# Patient Record
Sex: Female | Born: 1935 | Race: White | Hispanic: No | Smoking: Former smoker
Health system: Southern US, Community
[De-identification: ages and names within clinical notes are randomized; demographics above are authoritative.]

## PROBLEM LIST (undated history)

## (undated) DIAGNOSIS — K5730 Diverticulosis of large intestine without perforation or abscess without bleeding: Secondary | ICD-10-CM

## (undated) DIAGNOSIS — Z5189 Encounter for other specified aftercare: Secondary | ICD-10-CM

## (undated) DIAGNOSIS — J449 Chronic obstructive pulmonary disease, unspecified: Secondary | ICD-10-CM

## (undated) DIAGNOSIS — K449 Diaphragmatic hernia without obstruction or gangrene: Secondary | ICD-10-CM

## (undated) DIAGNOSIS — R0602 Shortness of breath: Secondary | ICD-10-CM

## (undated) DIAGNOSIS — I509 Heart failure, unspecified: Secondary | ICD-10-CM

## (undated) DIAGNOSIS — I714 Abdominal aortic aneurysm, without rupture, unspecified: Secondary | ICD-10-CM

## (undated) DIAGNOSIS — J40 Bronchitis, not specified as acute or chronic: Secondary | ICD-10-CM

## (undated) DIAGNOSIS — IMO0001 Reserved for inherently not codable concepts without codable children: Secondary | ICD-10-CM

## (undated) DIAGNOSIS — M40209 Unspecified kyphosis, site unspecified: Secondary | ICD-10-CM

## (undated) DIAGNOSIS — Z87898 Personal history of other specified conditions: Secondary | ICD-10-CM

## (undated) DIAGNOSIS — K8020 Calculus of gallbladder without cholecystitis without obstruction: Secondary | ICD-10-CM

## (undated) DIAGNOSIS — D649 Anemia, unspecified: Secondary | ICD-10-CM

## (undated) DIAGNOSIS — S22000A Wedge compression fracture of unspecified thoracic vertebra, initial encounter for closed fracture: Secondary | ICD-10-CM

## (undated) DIAGNOSIS — I10 Essential (primary) hypertension: Secondary | ICD-10-CM

## (undated) DIAGNOSIS — I6529 Occlusion and stenosis of unspecified carotid artery: Secondary | ICD-10-CM

## (undated) DIAGNOSIS — N390 Urinary tract infection, site not specified: Secondary | ICD-10-CM

## (undated) DIAGNOSIS — I2510 Atherosclerotic heart disease of native coronary artery without angina pectoris: Secondary | ICD-10-CM

## (undated) HISTORY — DX: Essential (primary) hypertension: I10

## (undated) HISTORY — DX: Chronic obstructive pulmonary disease, unspecified: J44.9

## (undated) HISTORY — PX: DILATION AND CURETTAGE OF UTERUS: SHX78

## (undated) HISTORY — DX: Calculus of gallbladder without cholecystitis without obstruction: K80.20

## (undated) HISTORY — DX: Unspecified kyphosis, site unspecified: M40.209

## (undated) HISTORY — DX: Abdominal aortic aneurysm, without rupture, unspecified: I71.40

## (undated) HISTORY — DX: Heart failure, unspecified: I50.9

## (undated) HISTORY — DX: Diverticulosis of large intestine without perforation or abscess without bleeding: K57.30

## (undated) HISTORY — DX: Occlusion and stenosis of unspecified carotid artery: I65.29

## (undated) HISTORY — DX: Personal history of other specified conditions: Z87.898

## (undated) HISTORY — DX: Anemia, unspecified: D64.9

## (undated) HISTORY — PX: ABDOMINAL HYSTERECTOMY: SHX81

## (undated) HISTORY — DX: Wedge compression fracture of unspecified thoracic vertebra, initial encounter for closed fracture: S22.000A

## (undated) HISTORY — DX: Diaphragmatic hernia without obstruction or gangrene: K44.9

## (undated) HISTORY — DX: Atherosclerotic heart disease of native coronary artery without angina pectoris: I25.10

## (undated) HISTORY — DX: Abdominal aortic aneurysm, without rupture: I71.4

## (undated) HISTORY — PX: SPINE SURGERY: SHX786

## (undated) HISTORY — PX: BLADDER SUSPENSION: SHX72

---

## 2006-09-10 DIAGNOSIS — S22000A Wedge compression fracture of unspecified thoracic vertebra, initial encounter for closed fracture: Secondary | ICD-10-CM

## 2006-09-10 HISTORY — DX: Wedge compression fracture of unspecified thoracic vertebra, initial encounter for closed fracture: S22.000A

## 2007-09-11 HISTORY — PX: CARDIAC CATHETERIZATION: SHX172

## 2008-07-15 ENCOUNTER — Ambulatory Visit (HOSPITAL_COMMUNITY): Admission: RE | Admit: 2008-07-15 | Discharge: 2008-07-15 | Payer: Self-pay | Admitting: Internal Medicine

## 2008-07-28 ENCOUNTER — Encounter: Payer: Self-pay | Admitting: Interventional Radiology

## 2008-08-02 ENCOUNTER — Inpatient Hospital Stay (HOSPITAL_COMMUNITY): Admission: AD | Admit: 2008-08-02 | Discharge: 2008-08-13 | Payer: Self-pay | Admitting: Cardiovascular Disease

## 2008-08-02 ENCOUNTER — Ambulatory Visit: Payer: Self-pay | Admitting: Cardiovascular Disease

## 2008-08-02 ENCOUNTER — Ambulatory Visit: Payer: Self-pay | Admitting: Cardiology

## 2008-08-03 ENCOUNTER — Ambulatory Visit: Payer: Self-pay | Admitting: Surgery

## 2008-08-03 ENCOUNTER — Encounter (INDEPENDENT_AMBULATORY_CARE_PROVIDER_SITE_OTHER): Payer: Self-pay | Admitting: Emergency Medicine

## 2010-10-01 ENCOUNTER — Encounter: Payer: Self-pay | Admitting: Internal Medicine

## 2010-12-06 ENCOUNTER — Other Ambulatory Visit (HOSPITAL_COMMUNITY): Payer: Self-pay | Admitting: Interventional Radiology

## 2010-12-06 DIAGNOSIS — IMO0002 Reserved for concepts with insufficient information to code with codable children: Secondary | ICD-10-CM

## 2010-12-12 ENCOUNTER — Ambulatory Visit (HOSPITAL_COMMUNITY): Payer: Self-pay

## 2010-12-18 ENCOUNTER — Other Ambulatory Visit (HOSPITAL_COMMUNITY): Payer: Self-pay | Admitting: Interventional Radiology

## 2010-12-18 DIAGNOSIS — IMO0002 Reserved for concepts with insufficient information to code with codable children: Secondary | ICD-10-CM

## 2010-12-19 ENCOUNTER — Ambulatory Visit (HOSPITAL_COMMUNITY): Payer: Self-pay

## 2010-12-19 ENCOUNTER — Other Ambulatory Visit: Payer: Self-pay | Admitting: Interventional Radiology

## 2010-12-19 ENCOUNTER — Other Ambulatory Visit (HOSPITAL_COMMUNITY): Payer: Self-pay | Admitting: Interventional Radiology

## 2010-12-19 ENCOUNTER — Other Ambulatory Visit (HOSPITAL_COMMUNITY): Payer: Self-pay

## 2010-12-19 ENCOUNTER — Ambulatory Visit (HOSPITAL_COMMUNITY)
Admission: RE | Admit: 2010-12-19 | Discharge: 2010-12-19 | Disposition: A | Discharge status: Home / Self Care | Payer: Medicare Other | Source: Ambulatory Visit | Attending: Interventional Radiology | Admitting: Interventional Radiology

## 2010-12-19 DIAGNOSIS — IMO0002 Reserved for concepts with insufficient information to code with codable children: Secondary | ICD-10-CM

## 2010-12-19 DIAGNOSIS — M810 Age-related osteoporosis without current pathological fracture: Secondary | ICD-10-CM | POA: Insufficient documentation

## 2010-12-19 DIAGNOSIS — M8448XA Pathological fracture, other site, initial encounter for fracture: Secondary | ICD-10-CM | POA: Insufficient documentation

## 2010-12-19 LAB — CBC
HCT: 31.2 % — ABNORMAL LOW (ref 36.0–46.0)
Hemoglobin: 9.7 g/dL — ABNORMAL LOW (ref 12.0–15.0)
MCH: 25.9 pg — ABNORMAL LOW (ref 26.0–34.0)
MCHC: 31.1 g/dL (ref 30.0–36.0)
MCV: 83.2 fL (ref 78.0–100.0)

## 2010-12-19 LAB — POCT I-STAT, CHEM 8
Calcium, Ion: 1.05 mmol/L — ABNORMAL LOW (ref 1.12–1.32)
Creatinine, Ser: 1 mg/dL (ref 0.4–1.2)
Glucose, Bld: 118 mg/dL — ABNORMAL HIGH (ref 70–99)
Hemoglobin: 11.2 g/dL — ABNORMAL LOW (ref 12.0–15.0)
Potassium: 4.1 mEq/L (ref 3.5–5.1)
TCO2: 23 mmol/L (ref 0–100)

## 2010-12-19 MED ORDER — IOHEXOL 300 MG/ML  SOLN
50.0000 mL | Freq: Once | INTRAMUSCULAR | Status: AC | PRN
  Administered 2010-12-19: 5 mL via INTRAVENOUS

## 2010-12-20 ENCOUNTER — Other Ambulatory Visit (HOSPITAL_COMMUNITY): Payer: Self-pay | Admitting: Interventional Radiology

## 2010-12-20 DIAGNOSIS — IMO0002 Reserved for concepts with insufficient information to code with codable children: Secondary | ICD-10-CM

## 2011-01-02 ENCOUNTER — Ambulatory Visit (HOSPITAL_COMMUNITY)
Admission: RE | Admit: 2011-01-02 | Discharge: 2011-01-02 | Disposition: A | Discharge status: Home / Self Care | Payer: Medicare Other | Source: Ambulatory Visit | Attending: Interventional Radiology | Admitting: Interventional Radiology

## 2011-01-02 DIAGNOSIS — IMO0002 Reserved for concepts with insufficient information to code with codable children: Secondary | ICD-10-CM

## 2011-01-23 NOTE — Cardiovascular Report (Signed)
NAMEJULLISA, Lisa Nichols              ACCOUNT NO.:  [id number]   MEDICAL RECORD NO.:  [id number]          PATIENT TYPE:  OIB   LOCATION:  4737                         FACILITY:  [facility]   PHYSICIAN:  Veverly Fells. Excell Seltzer, MD  DATE OF BIRTH:  09-04-1924   DATE OF PROCEDURE:  08/02/2008  DATE OF DISCHARGE:                            CARDIAC CATHETERIZATION   PROCEDURE:  Left heart catheterization, selective coronary angiography,  left ventricular angiography, and aortic arch angiography.   INDICATIONS:  Lisa Nichols is an 77 year old woman with multiple cardiac  risk factors and long-standing heavy tobacco use who presented with  chest pain today to [facility].  She had subtle EKG changes  suggestive of inferior injury.  A 2-D echocardiogram was performed at  the bedside that showed inferior wall hypokinesis.  In the setting of  ongoing chest pain, she was transferred here urgently for cardiac  catheterization for suspected acute coronary syndrome.   Risks and indications of the procedure were reviewed with the patient.  She had received morphine en route and consent was obtained under  emergency circumstances in the setting of presumed ACS.  The right groin  was prepped, draped, and anesthetized with 1% lidocaine.  Using modified  Seldinger technique, a 5-French sheath was placed in the right femoral  artery.  Standard 5-French Judkins catheters were used for coronary  angiography and left ventriculography.  The pigtail catheter was pulled  back into the aortic root and an aortic arch angiogram was performed.  All catheter exchanges were performed over a guidewire.  The patient  tolerated the procedure well.  There were no immediate complications.   FINDINGS:  Aortic pressure 163/69 with a mean of 108.  Left ventricular  pressure 168/22.   Fluoroscopy demonstrates a severely calcified coronary arteries, aortic  valve, and thoracic aorta.   Left mainstem:  Proximally, there are mild  luminal irregularities.  The  distal left mainstem has a shelf-like 30-40% stenosis prior to the  bifurcation of the LAD and left circumflex.  There is no high-grade  disease of the left main.   LAD:  The LAD is severely calcified.  It courses down and reaches the LV  apex.  The LAD has mild plaque proximally, there is moderate plaque in  the midportion just beyond the first diagonal branch with a 40% diffuse  stenosis.  The remaining portions of the mid and distal LAD are free of  significant angiographic disease.  There is a large diagonal branch that  arises from the proximal LAD and it has mild plaque at the ostium.   Left circumflex:  The left circumflex is heavily calcified.  There are  large first and second OM branches.  There is mild diffuse plaque in the  left circumflex, but no significant obstructive disease.   Right coronary artery:  The right coronary artery has diffuse  nonobstructive plaque, it is also heavily calcified.  There is a 30%  proximal lesion and a 40% lesion at the junction of the mid and distal  vessel.  The RCA supplies twin PDA branches and a posterolateral branch.  The  branch vessels of the right coronary artery have no significant  obstructive disease.   Left ventriculography demonstrates normal LV systolic function with an  LVEF estimated at 65%.  There is no significant mitral regurgitation.   Aortic arch angiography demonstrates a type 3 arch.  The aorta has  severe calcification and has the appearance of a porcelain aorta.  There is no dilatation or suggestion of aortic dissection.   ASSESSMENT:  1. Diffuse, calcified, nonobstructive coronary artery disease.  2. Normal left ventricular function.  3. Heavily calcified aorta.   RECOMMENDATIONS:  We will monitor Lisa Nichols and assess for other  potential etiologies of her chest pain.  At the completion of the  procedure, she was chest painfree.  We will treat her medically for  nonobstructive  CAD.      Veverly Fells. Excell Seltzer, MD  Electronically Signed     MDC/MEDQ  D:  08/02/2008  T:  08/03/2008  Job:  [id number]

## 2011-01-23 NOTE — Discharge Summary (Signed)
Lisa Nichols, Lisa Nichols              ACCOUNT NO.:  [id number]   MEDICAL RECORD NO.:  [id number]          PATIENT TYPE:  INP   LOCATION:  5159                         FACILITY:  [facility]   PHYSICIAN:  Veverly Fells. Excell Seltzer, MD  DATE OF BIRTH:  02-03-1924   DATE OF ADMISSION:  08/02/2008  DATE OF DISCHARGE:  08/13/2008                               DISCHARGE SUMMARY   PRIMARY CARDIOLOGIST:  Dr. Lewayne Bunting.   PRIMARY CARE PHYSICIAN:  Dr. Sherril Croon in [place].   PROCEDURES PERFORMED DURING HOSPITALIZATION:  Cardiac catheterization  completed by Dr. Tonny Bollman on August 02, 2008, revealing diffuse  calcified nonobstructive CAD, normal LV function, heavily calcified  aorta, 30% ostial circumflex, 40% mid LAD after the first diagonal, and  30% proximal right coronary artery.  LV function is 65% with no MR.   FINAL DISCHARGE DIAGNOSES:  1. Chest pain.  2. Nonobstructive coronary artery disease status post cardiac      catheterization dated August 02, 2008.  3. Anemia.  4. Abdominal aortic aneurysm stable in size, 5.5 cm.  5. Chronic obstructive pulmonary disease.  6. History of syncope.  7. Hypoalbuminemia.  8. Thoracic compression fracture in the T7-T8 in February of 2008.   HOSPITAL COURSE:  This is an 77 year old female with no documented  history of coronary artery disease but had numerous cardiovascular risk  factors who was seen in the emergency room by Dr. Lewayne Bunting on August 02, 2008, for complaints of chest pain.  The patient had recently  undergone a vertebroplasty by Dr. Corliss Skains at [facility] for  L1 compression fracture and had done well since surgery and had been  ambulating with the use of a walker.  The patient states that she awoke  with substernal chest pain, 10/10, with radiation to the mid scapular  and left shoulder region with moderate associated shortness of breath.  The patient had taken her primary pre-medications prior to coming to the  emergency room  and has not had any medical noncompliance.  The patient  was given nitroglycerin followed by morphine with relief in the  emergency room.  The initial EKG in the emergency room showed a 1-mm J-  point elevation in the inferior and high lateral leads with a repeat EKG  showing improvement in the J-point and chronic incomplete right bundle  branch block.  She also had cardiac enzymes completed which were normal.  The patient did undergo a stat echocardiogram at the bedside per Dr.  Andee Lineman showing a preserved LV function of 50% to 55% but with wall  motion abnormality in the inferior and posterior wall.  Therefore, the  patient was transferred to [facility] for cardiac  catheterization and possible intervention as necessary.   The patient was seen in [facility] by Dr. Tonny Bollman on  August 02, 2008, with subsequent cardiac catheterization as above.  The patient had nonobstructive coronary artery disease.  The patient  continued to have some chest pain with cough although it was  nonproductive.  The patient recovered from the cardiac catheterization  without any evidence of bleeding,  hematoma, or infection.  The patient  was monitored and found that the chest discomfort was more pleuritic in  nature with cough and she also had a question of pericarditis secondary  to some PR depression on EKG.  The patient also had complaints of pain  in her right groin at the catheterization insertion site which was  evaluated by Doppler ultrasound and found to be negative for a  pseudoaneurysm.  However, there was a mild groin hematoma.  The patient  continued to have soreness there but recovered.  The patient had also  complaints of generalized weakness and she lives alone and is unable to  take care of herself therefore case worker was consulted for a skilled  nursing home placement.   On the days following, the patient was found to have some dark, tarry  stools and during  evaluation for nursing home placement her Hemoccult  was checked to evaluate for positive for blood.  There was no evidence  of blood in her stool.  The patient had some mild anemia post procedure  with a hemoglobin of 8.2, however, this was improved without  transfusion.  The patient's recovery continued to progress while efforts  were made to find suitable placement in nursing home.   The patient was also evaluated by occupational therapy and PT with  recommendations by them to continue this on discharge to skilled nursing  facility.  The patient's tarry, dark stools were found to be negative  for blood and was found to be related to iron supplement.  The patient  was continued on a PPI as well and given stool softener.   The patient was continued to be monitored while awaiting availability at  skilled nursing home placement and a bed was found to be available at  [facility] in [place].  The patient was seen and examined by Dr. Tonny Bollman on day of discharge and found to be stable and will be  transferred to [facility] today.  From Dr. Earmon Phoenix evaluation,  she will not need any further cardiology followup and should be  continued to have followup by nursing home physician.  It is noted,  however, that the patient will need to have a CBC completed in 2 weeks  secondary to chronic anemia.   DISCHARGE LABS:  Hemoglobin 9.3, hematocrit 27.7, white blood cells 8.7,  platelets 353, sodium 133, potassium 3.8, chloride 98, CO2 of 27,  glucose 117, BUN 18, creatinine 0.85.  Initial chest x-ray on day of  admission, August 03, 2008, revealed suspect mild CHF and COPD.   VITAL SIGNS AT DISCHARGE:  Blood pressure 142/68.  Pulse 72.  Respirations 18.  Temperature 98.9.  O2 sat 95% on room air.   DISCHARGE MEDICATIONS:  1. Aspirin 81 mg daily.  2. Lopressor 25 mg twice a day.  3. Crestor 5 mg daily.  4. Protonix 40 mg daily.  5. Nitroglycerin 0.4 mg p.r.n. chest pain.  6.  Albuterol nebulizer 2.5 mg every 4 hours as needed.  7. Percocet 5/325 one to 2 tabs as needed p.r.n. severe chest pain.   ALLERGIES:  PHENOBARBITAL.   FOLLOWUP PLANS APPOINTMENT:  1. The patient will be transferred to [facility] where she      will be followed by the nursing home physician.  2. The should have a CBC drawn in 2 weeks for evaluation for anemia.  3. No further cardiac followup is needed at this time and will  continue with primary care physician.   TIME SPENT WITH THE PATIENT:  To include physician time, 35 minutes.      Bettey Mare. Lyman Bishop, NP      Veverly Fells. Excell Seltzer, MD  Electronically Signed    KML/MEDQ  D:  08/13/2008  T:  08/13/2008  Job:  [id number]

## 2011-06-12 LAB — URINALYSIS, ROUTINE W REFLEX MICROSCOPIC
Bilirubin Urine: NEGATIVE
Ketones, ur: NEGATIVE mg/dL
Nitrite: NEGATIVE
pH: 6 (ref 5.0–8.0)

## 2011-06-12 LAB — DIFFERENTIAL
Basophils Absolute: 0
Basophils Relative: 0 % (ref 0–1)
Basophils Relative: 1
Eosinophils Absolute: 0.2 10*3/uL (ref 0.0–0.7)
Eosinophils Absolute: 0.3
Eosinophils Relative: 2 % (ref 0–5)
Eosinophils Relative: 3
Lymphs Abs: 1.1
Monocytes Relative: 8 % (ref 3–12)
Neutrophils Relative %: 80 % — ABNORMAL HIGH (ref 43–77)
Neutrophils Relative %: 75

## 2011-06-12 LAB — CBC
HCT: 26.3 % — ABNORMAL LOW (ref 36.0–46.0)
HCT: 27.5 % — ABNORMAL LOW (ref 36.0–46.0)
HCT: 27.9 % — ABNORMAL LOW (ref 36.0–46.0)
HCT: 32.2 — ABNORMAL LOW
Hemoglobin: 8.2 g/dL — ABNORMAL LOW (ref 12.0–15.0)
Hemoglobin: 8.8 g/dL — ABNORMAL LOW (ref 12.0–15.0)
MCHC: 32.2 g/dL (ref 30.0–36.0)
MCHC: 32.9 g/dL (ref 30.0–36.0)
MCHC: 33.5 g/dL (ref 30.0–36.0)
MCHC: 33.5 g/dL (ref 30.0–36.0)
MCHC: 32.5
MCV: 83.2 fL (ref 78.0–100.0)
MCV: 83.7 fL (ref 78.0–100.0)
MCV: 84.4 fL (ref 78.0–100.0)
MCV: 84.9 fL (ref 78.0–100.0)
MCV: 85.4 fL (ref 78.0–100.0)
MCV: 85.8
Platelets: 300 10*3/uL (ref 150–400)
Platelets: 225
RBC: 2.91 MIL/uL — ABNORMAL LOW (ref 3.87–5.11)
RBC: 3.06 MIL/uL — ABNORMAL LOW (ref 3.87–5.11)
RBC: 3.25 MIL/uL — ABNORMAL LOW (ref 3.87–5.11)
RBC: 3.27 MIL/uL — ABNORMAL LOW (ref 3.87–5.11)
RBC: 3.28 MIL/uL — ABNORMAL LOW (ref 3.87–5.11)
RDW: 16.5 % — ABNORMAL HIGH (ref 11.5–15.5)
RDW: 16.8 — ABNORMAL HIGH
WBC: 14.4 10*3/uL — ABNORMAL HIGH (ref 4.0–10.5)
WBC: 9.1 10*3/uL (ref 4.0–10.5)
WBC: 9.2 10*3/uL (ref 4.0–10.5)
WBC: 9.6

## 2011-06-12 LAB — BASIC METABOLIC PANEL
BUN: 23 mg/dL (ref 6–23)
CO2: 25 mEq/L (ref 19–32)
CO2: 27 mEq/L (ref 19–32)
CO2: 27 mEq/L (ref 19–32)
Calcium: 8.5 mg/dL (ref 8.4–10.5)
Calcium: 8.5 mg/dL (ref 8.4–10.5)
Chloride: 101 mEq/L (ref 96–112)
Chloride: 102 mEq/L (ref 96–112)
Chloride: 102 mEq/L (ref 96–112)
Chloride: 98 mEq/L (ref 96–112)
Creatinine, Ser: 0.87 mg/dL (ref 0.4–1.2)
Creatinine, Ser: 0.87 mg/dL (ref 0.4–1.2)
GFR calc Af Amer: >60 mL/min (ref >60)
GFR calc Af Amer: >60 mL/min (ref >60)
GFR calc Af Amer: >60 mL/min (ref >60)
GFR calc Af Amer: >60 mL/min (ref >60)
GFR calc non Af Amer: 56 mL/min — ABNORMAL LOW (ref >60)
Glucose, Bld: 109 mg/dL — ABNORMAL HIGH (ref 70–99)
Potassium: 3.9 mEq/L (ref 3.5–5.1)
Potassium: 4.1 mEq/L (ref 3.5–5.1)
Sodium: 132 mEq/L — ABNORMAL LOW (ref 135–145)
Sodium: 133 mEq/L — ABNORMAL LOW (ref 135–145)

## 2011-06-12 LAB — PROTIME-INR
INR: 1.1
Prothrombin Time: 14.2

## 2011-06-12 LAB — LIPID PANEL
HDL: 32 mg/dL — ABNORMAL LOW (ref >39)
Triglycerides: 59 mg/dL (ref <150)
VLDL: 12 mg/dL (ref 0–40)

## 2011-06-12 LAB — IRON AND TIBC

## 2011-06-12 LAB — RETICULOCYTES: RBC.: 3.32 MIL/uL — ABNORMAL LOW (ref 3.87–5.11)

## 2011-06-12 LAB — HEMOGLOBIN A1C
Hgb A1c MFr Bld: 6 % (ref 4.6–6.1)
Mean Plasma Glucose: 126 mg/dL

## 2011-06-12 LAB — GLUCOSE, CAPILLARY: Glucose-Capillary: 167 mg/dL — ABNORMAL HIGH (ref 70–99)

## 2011-06-15 LAB — TYPE AND SCREEN: Antibody Screen: NEGATIVE

## 2011-06-15 LAB — CBC
Hemoglobin: 9.7 g/dL — ABNORMAL LOW (ref 12.0–15.0)
MCHC: 32 g/dL (ref 30.0–36.0)
MCHC: 33.5 g/dL (ref 30.0–36.0)
MCV: 84.5 fL (ref 78.0–100.0)
Platelets: 393 10*3/uL (ref 150–400)
RBC: 3.28 MIL/uL — ABNORMAL LOW (ref 3.87–5.11)
RDW: 16.6 % — ABNORMAL HIGH (ref 11.5–15.5)
RDW: 17.1 % — ABNORMAL HIGH (ref 11.5–15.5)

## 2011-06-15 LAB — BASIC METABOLIC PANEL
BUN: 18 mg/dL (ref 6–23)
CO2: 27 mEq/L (ref 19–32)
Calcium: 8.9 mg/dL (ref 8.4–10.5)
Creatinine, Ser: 0.85 mg/dL (ref 0.4–1.2)
GFR calc non Af Amer: >60 mL/min (ref >60)
Glucose, Bld: 117 mg/dL — ABNORMAL HIGH (ref 70–99)
Sodium: 133 mEq/L — ABNORMAL LOW (ref 135–145)

## 2011-09-10 DIAGNOSIS — I509 Heart failure, unspecified: Secondary | ICD-10-CM

## 2011-09-12 DIAGNOSIS — I5023 Acute on chronic systolic (congestive) heart failure: Secondary | ICD-10-CM

## 2011-09-13 DIAGNOSIS — I714 Abdominal aortic aneurysm, without rupture: Secondary | ICD-10-CM

## 2011-09-13 DIAGNOSIS — I2510 Atherosclerotic heart disease of native coronary artery without angina pectoris: Secondary | ICD-10-CM

## 2011-09-14 ENCOUNTER — Encounter: Payer: Self-pay | Admitting: Surgery

## 2011-09-14 DIAGNOSIS — R072 Precordial pain: Secondary | ICD-10-CM

## 2011-09-17 ENCOUNTER — Encounter: Payer: Federal, State, Local not specified - PPO | Admitting: Surgery

## 2011-10-12 ENCOUNTER — Encounter: Payer: Self-pay | Admitting: Surgery

## 2011-10-15 ENCOUNTER — Ambulatory Visit (INDEPENDENT_AMBULATORY_CARE_PROVIDER_SITE_OTHER): Payer: Medicare Other | Admitting: Surgery

## 2011-10-15 ENCOUNTER — Encounter: Payer: Self-pay | Admitting: Surgery

## 2011-10-15 VITALS — BP 184/84 | HR 73 | Resp 16 | Ht 63.0 in | Wt 96.0 lb

## 2011-10-15 DIAGNOSIS — I714 Abdominal aortic aneurysm, without rupture, unspecified: Secondary | ICD-10-CM | POA: Insufficient documentation

## 2011-10-15 NOTE — Progress Notes (Addendum)
[facility] [place]   Patient name: Lisa Nichols MRN: [id number] DOB: 03/18/1924 Sex: female   Referred by: Dr Lisa Nichols  Reason for referral:  Chief Complaint  Patient presents with  . AAA    Eval of AAA per Dr. Vyas     HISTORY OF PRESENT ILLNESS: This is a very pleasant 78-year-old female that I had seen for evaluation of an abdominal aortic aneurysm. The patient was recently hospitalized in [place] for shortness of breath and hypoxia. The most likely diagnosis was heart failure. She is now on 2 L of oxygen. During her hospitalization she was found to have an abdominal aortic aneurysm measuring 6.5 cm in maximum diameter she initially refused to be seen by vascular surgeons because she did not want this repaired however has subsequently changed her mind. She denies having any abdominal pain or back pain at this time.  The patient has a history of type 2 diabetes which is medically managed. She is known carotid artery stenosis. The most recent ultrasound I confined shows 40-59% stenosis. She has COPD and is on 2 L of home oxygen, continuously. She is also a greater than 50-pack-year history of smoking.  Past Medical History  Diagnosis Date  . CAD (coronary artery disease)   . Anemia   . AAA (abdominal aortic aneurysm)   . COPD (chronic obstructive pulmonary disease)   . History of syncope   . Compression fracture of thoracic vertebra 2008    T7- T8  . Cholelithiases   . Carotid artery occlusion   . Diverticulosis of colon   . Diabetes mellitus   . Hypertension   . Hiatal hernia   . CHF (congestive heart failure)   . Kyphosis     Past Surgical History  Procedure Date  . Spine surgery     kyphoplasty  . Cardiac catheterization 2009    History   Social History  . Marital Status: Widowed    Spouse Name: N/A    Number of Children: N/A  . Years of Education: N/A   Occupational History  . Not on file.   Social History Main Topics  . Smoking status:  Former Smoker    Quit date: 10/15/2007  . Smokeless tobacco: Not on file  . Alcohol Use: No  . Drug Use: No  . Sexually Active:    Other Topics Concern  . Not on file   Social History Narrative  . No narrative on file    Family History  Problem Relation Age of Onset  . Heart disease Sister   . Heart disease Brother     Allergies as of 10/15/2011 - Review Complete 10/15/2011  Allergen Reaction Noted  . Barbiturates  09/14/2011  . Phenobarbital  09/14/2011    Current Outpatient Prescriptions on File Prior to Visit  Medication Sig Dispense Refill  . albuterol (ACCUNEB) 1.25 MG/3ML nebulizer solution Take 1 ampule by nebulization every 6 (six) hours as needed.        . aspirin 81 MG tablet Take 160 mg by mouth daily.        . metoprolol tartrate (LOPRESSOR) 25 MG tablet Take 25 mg by mouth 2 (two) times daily.        . nitroGLYCERIN (NITROSTAT) 0.4 MG SL tablet Place 0.4 mg under the tongue every 5 (five) minutes as needed.        . citalopram (CELEXA) 20 MG tablet Take 20 mg by mouth daily.        .   guaiFENesin (MUCINEX) 600 MG 12 hr tablet Take 1,200 mg by mouth 2 (two) times daily.        . omeprazole (PRILOSEC) 40 MG capsule Take 40 mg by mouth daily.        . pantoprazole (PROTONIX) 40 MG tablet Take 40 mg by mouth daily.        . Respiratory Therapy Supplies (PULMO-AIDE COMP/PULMO-NEB DISP) DEVI by Does not apply route.        . rosuvastatin (CRESTOR) 5 MG tablet Take 5 mg by mouth daily.           REVIEW OF SYSTEMS: Cardiovascular: No chest pain, chest pressure, palpitations, orthopnea, or dyspnea on exertion. No claudication or rest pain,  No history of DVT or phlebitis. Pulmonary: No productive cough, asthma or wheezing. Neurologic: No weakness, paresthesias, aphasia, or amaurosis. No dizziness. Hematologic: No bleeding problems or clotting disorders. Musculoskeletal: No joint pain or joint swelling. Gastrointestinal: No blood in stool or  hematemesis Genitourinary: No dysuria or hematuria. Psychiatric:: No history of major depression. Integumentary: No rashes or ulcers. Constitutional: No fever or chills.  PHYSICAL EXAMINATION: General: The patient appears their stated age.  Vital signs are BP 184/84  Pulse 73  Resp 16  Ht 5' 3" (1.6 m)  Wt 96 lb (43.545 kg)  BMI 17.01 kg/m2  SpO2 97% HEENT:  No gross abnormalities Pulmonary: Respirations are non-labored  Abdomen: Soft and non-tender nontender aorta Musculoskeletal: There are no major deformities.   Neurologic: No focal weakness or paresthesias are detected, Skin: There are no ulcer or rashes noted. Psychiatric: The patient has normal affect. Cardiovascular: There is a regular rate and rhythm without significant murmur appreciated. No carotid bruits palpable pedal pulses bilaterally   Diagnostic Studies: CT angiogram reveals a 6.5 cm infrarenal abdominal aortic aneurysm    Medication Changes: None  Assessment:  Large abdominal aortic aneurysm Plan: The patient is here today with her knees. We discussed our options at this time. I feel that she is relatively high-risk for rupture given the size of her aneurysm. However, with her age and other comorbidities we discussed continued observation. The patient states that she is here today because she wishes to get this fixed. Based on my review of the images I feel this can be done using a endovascular prosthesis. Most likely this can be done percutaneously. I discussed the risks and benefits of proceeding with surgery including the risk of intestinal ischemia cardiopulmonary complications, stroke, lower extremity embolization as well as bleeding. They wished to proceed. Because the patient is on home oxygen I feel like we can do this under local anesthesia with mild sedation. It is been scheduled for Friday, February 22 using a Gore Excluder endoprosthesis. She reportedly had a stress test done recently that was  negative  Labs Results for Lisa Nichols, Lisa Nichols (MRN [id number]) as of 11/01/2011 15:35  Ref. Range 12/19/2010 10:11 10/25/2011 15:01  Sample type No range found  ARTERIAL DRAW  FIO2 No range found  0.21  pH, Arterial Latest Range: 7.350-7.400   7.409 (H)  pCO2 arterial Latest Range: 35.0-45.0 mmHg  35.1  pO2, Arterial Latest Range: 80.0-100.0 mmHg  115.0 (H)  Bicarbonate Latest Range: 20.0-24.0 mEq/L  21.7  TCO2 Latest Range: 0-100 mmol/L 23 22.8  Acid-base deficit Latest Range: 0.0-2.0 mmol/L  2.2 (H)  O2 Saturation No range found  99.0  Patient temperature No range found  98.6  Collection site No range found  LEFT BRACHIAL  Allens test (pass/fail) Latest Range: PASS     PASS   Results for Andre, Ambera (MRN [id number]) as of 11/01/2011 15:35  Ref. Range 10/25/2011 15:01  Sodium Latest Range: 135-145 mEq/L 134 (L)  Potassium Latest Range: 3.5-5.1 mEq/L 4.2  Chloride Latest Range: 96-112 mEq/L 103  CO2 Latest Range: 19-32 mEq/L 22  BUN Latest Range: 6-23 mg/dL 32 (H)  Creat Latest Range: 0.50-1.10 mg/dL 0.97  Calcium Latest Range: 8.4-10.5 mg/dL 9.2  GFR calc non Af Amer Latest Range: >90 mL/min 52 (L)  GFR calc Af Amer Latest Range: >90 mL/min 60 (L)  Glucose Latest Range: 70-99 mg/dL 122 (H)  Alkaline Phosphatase Latest Range: 39-117 U/L 84  Albumin Latest Range: 3.5-5.2 g/dL 2.9 (L)  AST Latest Range: 0-37 U/L 14  ALT Latest Range: 0-35 U/L 6  Total Protein Latest Range: 6.0-8.3 g/dL 7.7  Total Bilirubin Latest Range: 0.3-1.2 mg/dL 0.2 (L)  Results for Saad, Nicolet (MRN [id number]) as of 11/01/2011 15:35  Ref. Range 10/25/2011 15:01  WBC Latest Range: 4.0-10.5 K/uL 9.0  RBC Latest Range: 3.87-5.11 MIL/uL 3.29 (L)  HGB Latest Range: 12.0-15.0 g/dL 8.5 (L)  HCT Latest Range: 36.0-46.0 % 27.3 (L)  Platelets Latest Range: 150-400 K/uL 255    V. Wells Jaquala Fuller IV, M.D. [facility] [place] Office: [phone] Pager:  [phone]    

## 2011-10-16 ENCOUNTER — Encounter: Payer: Self-pay | Admitting: Internal Medicine

## 2011-10-23 ENCOUNTER — Encounter (HOSPITAL_COMMUNITY): Payer: Self-pay | Admitting: Pharmacy Technician

## 2011-10-24 ENCOUNTER — Other Ambulatory Visit: Payer: Self-pay

## 2011-10-25 ENCOUNTER — Encounter (HOSPITAL_COMMUNITY): Payer: Self-pay

## 2011-10-25 ENCOUNTER — Encounter (HOSPITAL_COMMUNITY)
Admission: RE | Admit: 2011-10-25 | Discharge: 2011-10-25 | Disposition: A | Discharge status: Home / Self Care | Payer: Medicare Other | Source: Ambulatory Visit | Attending: Anesthesiology | Admitting: Anesthesiology

## 2011-10-25 ENCOUNTER — Encounter (HOSPITAL_COMMUNITY)
Admission: RE | Admit: 2011-10-25 | Discharge: 2011-10-25 | Disposition: A | Discharge status: Home / Self Care | Payer: Medicare Other | Source: Ambulatory Visit | Attending: Surgery | Admitting: Surgery

## 2011-10-25 HISTORY — DX: Bronchitis, not specified as acute or chronic: J40

## 2011-10-25 HISTORY — DX: Urinary tract infection, site not specified: N39.0

## 2011-10-25 HISTORY — DX: Reserved for inherently not codable concepts without codable children: IMO0001

## 2011-10-25 HISTORY — DX: Encounter for other specified aftercare: Z51.89

## 2011-10-25 LAB — COMPREHENSIVE METABOLIC PANEL
AST: 14 U/L (ref 0–37)
Albumin: 2.9 g/dL — ABNORMAL LOW (ref 3.5–5.2)
BUN: 32 mg/dL — ABNORMAL HIGH (ref 6–23)
Chloride: 103 mEq/L (ref 96–112)
Creatinine, Ser: 0.97 mg/dL (ref 0.50–1.10)
Potassium: 4.2 mEq/L (ref 3.5–5.1)
Total Bilirubin: 0.2 mg/dL — ABNORMAL LOW (ref 0.3–1.2)
Total Protein: 7.7 g/dL (ref 6.0–8.3)

## 2011-10-25 LAB — DIFFERENTIAL
Basophils Relative: 0 % (ref 0–1)
Lymphocytes Relative: 22 % (ref 12–46)
Lymphs Abs: 2 10*3/uL (ref 0.7–4.0)
Monocytes Absolute: 0.9 10*3/uL (ref 0.1–1.0)
Monocytes Relative: 11 % (ref 3–12)
Neutro Abs: 5.7 10*3/uL (ref 1.7–7.7)
Neutrophils Relative %: 64 % (ref 43–77)

## 2011-10-25 LAB — BLOOD GAS, ARTERIAL
Drawn by: 344381
Patient temperature: 98.6
pCO2 arterial: 35.1 mmHg (ref 35.0–45.0)
pH, Arterial: 7.409 — ABNORMAL HIGH (ref 7.350–7.400)

## 2011-10-25 LAB — URINE MICROSCOPIC-ADD ON

## 2011-10-25 LAB — SURGICAL PCR SCREEN
MRSA, PCR: NEGATIVE
Staphylococcus aureus: NEGATIVE

## 2011-10-25 LAB — APTT: aPTT: 27 seconds (ref 24–37)

## 2011-10-25 LAB — URINALYSIS, ROUTINE W REFLEX MICROSCOPIC
Glucose, UA: NEGATIVE mg/dL
Ketones, ur: NEGATIVE mg/dL
Protein, ur: NEGATIVE mg/dL
Urobilinogen, UA: 0.2 mg/dL (ref 0.0–1.0)

## 2011-10-25 LAB — CBC
MCHC: 31.1 g/dL (ref 30.0–36.0)
MCV: 83 fL (ref 78.0–100.0)
Platelets: 255 10*3/uL (ref 150–400)
RDW: 17.3 % — ABNORMAL HIGH (ref 11.5–15.5)
WBC: 9 10*3/uL (ref 4.0–10.5)

## 2011-10-25 NOTE — Progress Notes (Signed)
Contacted Judy with Dr. Estanislado Spire office to verify if to continue taking aspirin.  States ok for patient to continue aspirin.

## 2011-10-25 NOTE — Brief Op Note (Signed)
Forwarded to [facility] to review notes regarding pulmonary hx.

## 2011-10-25 NOTE — Pre-Procedure Instructions (Signed)
20 Inger Wiest  10/25/2011   Your procedure is scheduled on:  Friday November 02, 2011  Report to [facility] at 0530 AM.  Call this number if you have problems the morning of surgery: [phone]   Remember:   Do not eat food:After Midnight.  May have clear liquids: up to 4 Hours before arrival. (up to 1:30am)  Clear liquids include soda, tea, black coffee, apple or grape juice, broth.  Take these medicines the morning of surgery with A SIP OF WATER: albuterol, metoprolol   Do not wear jewelry, make-up or nail polish.  Do not wear lotions, powders, or perfumes. You may wear deodorant.  Do not shave 48 hours prior to surgery.  Do not bring valuables to the hospital.  Contacts, dentures or bridgework may not be worn into surgery.  Leave suitcase in the car. After surgery it may be brought to your room.  For patients admitted to the hospital, checkout time is 11:00 AM the day of discharge.   Patients discharged the day of surgery will not be allowed to drive home.  Name and phone number of your driver: [facility] [phone]  Special Instructions: CHG Shower Use Special Wash: 1/2 bottle night before surgery and 1/2 bottle morning of surgery.   Please read over the following fact sheets that you were given: Pain Booklet, Coughing and Deep Breathing, Blood Transfusion Information, MRSA Information and Surgical Site Infection Prevention

## 2011-10-25 NOTE — Progress Notes (Signed)
Pat instructions given to Lisa Nichols with [facility].

## 2011-10-25 NOTE — Progress Notes (Signed)
Request faxed to [facility] med records for recent EKG and  CXR.

## 2011-10-26 NOTE — Consult Note (Addendum)
Anesthesia:  Patient is a 78 year old female scheduled for a EVAR of a AAA on 11/02/11.  Due to her pulmonary history, Dr. Myra Nichols is considering doing this procedure with local and MAC anesthesia.  History includes CAD, compression fracture T7-8, carotid occlusive disease (40-59%), HH, kyphosis, COPD/bronchitis with home O2 @ 2L, former smoker, DM2, HTN, anemia, and hospitalization in December 2012 for CHF.  She has been evaluated by a Lisa Nichols Cardiologist in the past, but their office says only in the hospital setting.  She saw Dr. Andee Nichols in 2009 and most recently Dr. Diona Nichols when she was in the [facility].    Her last EKG available is from [facility] on 09/10/11.  The copy is dark, but report shows SR, PAC's, incomplete right BBB, borderline lateral ST elevation.  I will plan to repeat her EKG on the day of surgery.    A Lexiscan stress test at [facility] on 09/14/11 showed normal LV perfusion and wall motion, EF 85%, no chest pain symptoms or EKG changes consistent with ischemia.   Echo on 09/12/11 showed normal LV systolic function, EF 60-65%, findings c/w diastolic dysfunction, normal RV systolic function, mitral annular calcification without evidence of MR.  Cardiac cath on 08/02/08 showed: 1. Diffuse, calcified, nonobstructive coronary artery disease.  2. Normal left ventricular function.  3. Heavily calcified aorta.  Medical therapy was recommended.  CXR from 10/25/11 showed: 1. Severe chronic interstitial and obstructive lung disease.  2. Numerous old spinal compression fractures.  3. Large abdominal aortic aneurysm.  4. Large hiatal hernia.   Labs noted.  Her H/H are down to 8.5/27.3.  UA shows moderate leukocytes, 7-10 WBC, but no nitrites.  These results were called to Lisa Done, RN at [facility] yesterday.  I don't have any recent comparison labs.  She'll review these findings.  I have ordered that the T&S be changed to a T&C for 2 Units to have available.  Additional orders  per Dr. Myra Nichols.    Addendum: 10/29/11 0950  Dr. Myra Nichols has reviewed Lisa Nichols' labs.  He agrees with T&C for 2 Units.  He is also having his staff call her in a prescription for Lisa Nichols for leukocytes present in her UA.

## 2011-10-29 ENCOUNTER — Telehealth: Payer: Self-pay | Admitting: *Deleted

## 2011-10-29 NOTE — Telephone Encounter (Signed)
Called Amanda,RN to order Cipro 500mg  BID for 5 days for UTI per Dr Myra Gianotti.

## 2011-11-01 ENCOUNTER — Inpatient Hospital Stay (HOSPITAL_COMMUNITY)
Admission: RE | Admit: 2011-11-01 | Discharge: 2011-11-13 | DRG: 238 | Disposition: A | Discharge status: Skilled Nursing Facility | Payer: Medicare Other | Source: Ambulatory Visit | Attending: Surgery | Admitting: Surgery

## 2011-11-01 ENCOUNTER — Other Ambulatory Visit: Payer: Self-pay

## 2011-11-01 ENCOUNTER — Encounter (HOSPITAL_COMMUNITY): Payer: Self-pay | Admitting: General Practice

## 2011-11-01 DIAGNOSIS — Z79899 Other long term (current) drug therapy: Secondary | ICD-10-CM

## 2011-11-01 DIAGNOSIS — J449 Chronic obstructive pulmonary disease, unspecified: Secondary | ICD-10-CM | POA: Diagnosis present

## 2011-11-01 DIAGNOSIS — E119 Type 2 diabetes mellitus without complications: Secondary | ICD-10-CM | POA: Diagnosis present

## 2011-11-01 DIAGNOSIS — Y832 Surgical operation with anastomosis, bypass or graft as the cause of abnormal reaction of the patient, or of later complication, without mention of misadventure at the time of the procedure: Secondary | ICD-10-CM | POA: Diagnosis not present

## 2011-11-01 DIAGNOSIS — I714 Abdominal aortic aneurysm, without rupture, unspecified: Principal | ICD-10-CM | POA: Diagnosis present

## 2011-11-01 DIAGNOSIS — Y9210 Unspecified residential institution as the place of occurrence of the external cause: Secondary | ICD-10-CM | POA: Diagnosis not present

## 2011-11-01 DIAGNOSIS — Z01812 Encounter for preprocedural laboratory examination: Secondary | ICD-10-CM

## 2011-11-01 DIAGNOSIS — I743 Embolism and thrombosis of arteries of the lower extremities: Secondary | ICD-10-CM | POA: Diagnosis not present

## 2011-11-01 DIAGNOSIS — Z7982 Long term (current) use of aspirin: Secondary | ICD-10-CM

## 2011-11-01 DIAGNOSIS — I6529 Occlusion and stenosis of unspecified carotid artery: Secondary | ICD-10-CM | POA: Diagnosis present

## 2011-11-01 DIAGNOSIS — K5730 Diverticulosis of large intestine without perforation or abscess without bleeding: Secondary | ICD-10-CM | POA: Diagnosis present

## 2011-11-01 DIAGNOSIS — M216X9 Other acquired deformities of unspecified foot: Secondary | ICD-10-CM | POA: Diagnosis not present

## 2011-11-01 DIAGNOSIS — IMO0002 Reserved for concepts with insufficient information to code with codable children: Secondary | ICD-10-CM | POA: Diagnosis not present

## 2011-11-01 DIAGNOSIS — J4489 Other specified chronic obstructive pulmonary disease: Secondary | ICD-10-CM | POA: Diagnosis present

## 2011-11-01 DIAGNOSIS — I2510 Atherosclerotic heart disease of native coronary artery without angina pectoris: Secondary | ICD-10-CM | POA: Diagnosis present

## 2011-11-01 DIAGNOSIS — I10 Essential (primary) hypertension: Secondary | ICD-10-CM | POA: Diagnosis present

## 2011-11-01 DIAGNOSIS — D62 Acute posthemorrhagic anemia: Secondary | ICD-10-CM | POA: Diagnosis not present

## 2011-11-01 DIAGNOSIS — Z9981 Dependence on supplemental oxygen: Secondary | ICD-10-CM

## 2011-11-01 DIAGNOSIS — M79A29 Nontraumatic compartment syndrome of unspecified lower extremity: Secondary | ICD-10-CM | POA: Diagnosis not present

## 2011-11-01 DIAGNOSIS — R627 Adult failure to thrive: Secondary | ICD-10-CM | POA: Diagnosis present

## 2011-11-01 DIAGNOSIS — Z87891 Personal history of nicotine dependence: Secondary | ICD-10-CM

## 2011-11-01 HISTORY — DX: Shortness of breath: R06.02

## 2011-11-01 MED ORDER — OXYCODONE-ACETAMINOPHEN 5-325 MG PO TABS: 1.0000 | ORAL_TABLET | ORAL | Status: DC | PRN

## 2011-11-01 MED ORDER — DEXTROSE 5 % IV SOLN
1.5000 g | Freq: Three times a day (TID) | INTRAVENOUS | Status: DC
  Administered 2011-11-01 – 2011-11-02 (×2): 1.5 g via INTRAVENOUS
  Filled 2011-11-01 (×5): qty 1.5

## 2011-11-01 MED ORDER — LABETALOL HCL 5 MG/ML IV SOLN
10.0000 mg | INTRAVENOUS | Status: DC | PRN
  Filled 2011-11-01: qty 4

## 2011-11-01 MED ORDER — METOPROLOL TARTRATE 25 MG PO TABS
25.0000 mg | ORAL_TABLET | Freq: Three times a day (TID) | ORAL | Status: DC
  Administered 2011-11-01 – 2011-11-13 (×33): 25 mg via ORAL
  Filled 2011-11-01 (×39): qty 1

## 2011-11-01 MED ORDER — PHENOL 1.4 % MT LIQD
1.0000 | OROMUCOSAL | Status: DC | PRN
  Filled 2011-11-01: qty 177

## 2011-11-01 MED ORDER — SODIUM CHLORIDE 0.9 % IV SOLN
INTRAVENOUS | Status: DC
  Administered 2011-11-01 – 2011-11-02 (×2): via INTRAVENOUS

## 2011-11-01 MED ORDER — ONDANSETRON HCL 4 MG/2ML IJ SOLN: 4.0000 mg | Freq: Four times a day (QID) | INTRAMUSCULAR | Status: DC | PRN

## 2011-11-01 MED ORDER — HYDRALAZINE HCL 20 MG/ML IJ SOLN
10.0000 mg | INTRAMUSCULAR | Status: DC | PRN
  Filled 2011-11-01: qty 0.5

## 2011-11-01 MED ORDER — DEXTROSE 5 % IV SOLN
1.5000 g | Freq: Three times a day (TID) | INTRAVENOUS | Status: DC
  Filled 2011-11-01 (×3): qty 1.5

## 2011-11-01 MED ORDER — METOPROLOL TARTRATE 1 MG/ML IV SOLN
2.0000 mg | INTRAVENOUS | Status: DC | PRN
  Administered 2011-11-02: 5 mg via INTRAVENOUS

## 2011-11-01 MED ORDER — NITROGLYCERIN 0.4 MG SL SUBL
0.4000 mg | SUBLINGUAL_TABLET | SUBLINGUAL | Status: DC | PRN
  Administered 2011-11-07 (×2): 0.4 mg via SUBLINGUAL
  Filled 2011-11-01 (×3): qty 25

## 2011-11-01 MED ORDER — ACETAMINOPHEN 650 MG RE SUPP: 325.0000 mg | RECTAL | Status: DC | PRN

## 2011-11-01 MED ORDER — DEXTROSE 5 % IV SOLN: 1.5000 g | Freq: Two times a day (BID) | INTRAVENOUS | Status: DC

## 2011-11-01 MED ORDER — SODIUM CHLORIDE 0.9 % IV SOLN: INTRAVENOUS | Status: DC

## 2011-11-01 MED ORDER — ALBUTEROL SULFATE (5 MG/ML) 0.5% IN NEBU
2.5000 mg | INHALATION_SOLUTION | Freq: Four times a day (QID) | RESPIRATORY_TRACT | Status: DC | PRN
  Administered 2011-11-11: 2.5 mg via RESPIRATORY_TRACT
  Filled 2011-11-01: qty 0.5

## 2011-11-01 MED ORDER — ACETAMINOPHEN 325 MG PO TABS
325.0000 mg | ORAL_TABLET | ORAL | Status: DC | PRN
  Administered 2011-11-02: 650 mg via ORAL
  Filled 2011-11-01: qty 2

## 2011-11-01 NOTE — Progress Notes (Signed)
ANTIBIOTIC CONSULT NOTE - INITIAL  Pharmacy Consult for adjustment of antibiotics for renal function Indication: post-op prophylaxis  Allergies  Allergen Reactions  . Barbiturates Other (See Comments)    unknown  . Phenobarbital Other (See Comments)    unknown    Patient Measurements:   Actual body weight = 44.5 kg  Vital Signs:   Intake/Output from previous day:   Intake/Output from this shift:    Labs: Scr = 0.97 on 2/14 as outpatient.  Estimated CrCl ~33 ml/min.  No results found for this basename: WBC:3,HGB:3,PLT:3,LABCREA:3,CREATININE:3 in the last 72 hours The CrCl is unknown because both a height and weight (above a minimum accepted value) are required for this calculation. No results found for this basename: VANCOTROUGH:2,VANCOPEAK:2,VANCORANDOM:2,GENTTROUGH:2,GENTPEAK:2,GENTRANDOM:2,TOBRATROUGH:2,TOBRAPEAK:2,TOBRARND:2,AMIKACINPEAK:2,AMIKACINTROU:2,AMIKACIN:2, in the last 72 hours   Microbiology: Recent Results (from the past 720 hour(s))  SURGICAL PCR SCREEN     Status: Normal   Collection Time   10/25/11  2:58 PM      Component Value Range Status Comment   MRSA, PCR NEGATIVE  NEGATIVE  Final    Staphylococcus aureus NEGATIVE  NEGATIVE  Final     Medical History: Past Medical History  Diagnosis Date  . CAD (coronary artery disease)   . Anemia   . AAA (abdominal aortic aneurysm)   . History of syncope   . Compression fracture of thoracic vertebra 2008    T7- T8  . Cholelithiases   . Carotid artery occlusion   . Diverticulosis of colon   . Hiatal hernia   . CHF (congestive heart failure)   . Kyphosis   . Blood transfusion   . Urinary tract infection     hx of  . Diabetes mellitus   . Bronchitis     hx of  . Hypertension     sees Dr. Iran Ouch, primary doctor   . COPD (chronic obstructive pulmonary disease)     on O2 at 2 L [place] continuous sees Dr. Sherril Croon    Medications:  Prescriptions prior to admission  Medication Sig Dispense Refill  . aspirin  81 MG chewable tablet Chew 81 mg by mouth daily.      Marland Kitchen albuterol (ACCUNEB) 1.25 MG/3ML nebulizer solution Take 1 ampule by nebulization 4 (four) times daily.       . metoprolol tartrate (LOPRESSOR) 25 MG tablet Take 25 mg by mouth 3 (three) times daily.       . nitroGLYCERIN (NITROSTAT) 0.4 MG SL tablet Place 0.4 mg under the tongue every 5 (five) minutes as needed. For chest pain      . Respiratory Therapy Supplies (PULMO-AIDE COMP/PULMO-NEB DISP) DEVI by Does not apply route.         Assessment: 78 yo female s/p abdominal aortic aneurysm repair to begin cefuroxime for post-op prophylaxis.  Pharmacy consulted for proper dosing based on renal function.  Based on CrCl of ~33 ml/min, should be adequate to continue cefuroxime 1.5 g IV q8 hrs as ordered.   Goal of Therapy:  Prevention of post-op infection  Plan:  1. Continue cefuroxime 1.5g IV q 8hrs.  1st dose due now. 2. Will watch renal function. 3. Will follow up for intended stop date for antibiotics.  Gardner Candle 11/01/2011,5:05 PM

## 2011-11-01 NOTE — H&P (Addendum)
HISTORY OF PRESENT ILLNESS:  This is a very pleasant 78 year old female that I had seen for evaluation of an abdominal aortic aneurysm. The patient was recently hospitalized in [place] for shortness of breath and hypoxia. The most likely diagnosis was heart failure. She is now on 2 L of oxygen. During her hospitalization she was found to have an abdominal aortic aneurysm measuring 6.5 cm in maximum diameter she initially refused to be seen by vascular surgeons because she did not want this repaired however has subsequently changed her mind. She denies having any abdominal pain or back pain at this time.  The patient has a history of type 2 diabetes which is medically managed. She is known carotid artery stenosis. The most recent ultrasound I confined shows 40-59% stenosis. She has COPD and is on 2 L of home oxygen, continuously. She is also a greater than 50-pack-year history of smoking.  Past Medical History   Diagnosis  Date   .  CAD (coronary artery disease)    .  Anemia    .  AAA (abdominal aortic aneurysm)    .  COPD (chronic obstructive pulmonary disease)    .  History of syncope    .  Compression fracture of thoracic vertebra  2008     T7- T8   .  Cholelithiases    .  Carotid artery occlusion    .  Diverticulosis of colon    .  Diabetes mellitus    .  Hypertension    .  Hiatal hernia    .  CHF (congestive heart failure)    .  Kyphosis     Past Surgical History   Procedure  Date   .  Spine surgery      kyphoplasty   .  Cardiac catheterization  2009    History    Social History   .  Marital Status:  Widowed     Spouse Name:  N/A     Number of Children:  N/A   .  Years of Education:  N/A    Occupational History   .  Not on file.    Social History Main Topics   .  Smoking status:  Former Smoker     Quit date:  10/15/2007   .  Smokeless tobacco:  Not on file   .  Alcohol Use:  No   .  Drug Use:  No   .  Sexually Active:     Other Topics  Concern   .  Not on file     Social History Narrative   .  No narrative on file    Family History   Problem  Relation  Age of Onset   .  Heart disease  Sister    .  Heart disease  Brother     Allergies as of 10/15/2011 - Review Complete 10/15/2011   Allergen  Reaction  Noted   .  Barbiturates   09/14/2011   .  Phenobarbital   09/14/2011    Current Outpatient Prescriptions on File Prior to Visit   Medication  Sig  Dispense  Refill   .  albuterol (ACCUNEB) 1.25 MG/3ML nebulizer solution  Take 1 ampule by nebulization every 6 (six) hours as needed.     Marland Kitchen  aspirin 81 MG tablet  Take 160 mg by mouth daily.     .  metoprolol tartrate (LOPRESSOR) 25 MG tablet  Take 25 mg by mouth 2 (two) times daily.     Marland Kitchen  nitroGLYCERIN (NITROSTAT) 0.4 MG SL tablet  Place 0.4 mg under the tongue every 5 (five) minutes as needed.     .  citalopram (CELEXA) 20 MG tablet  Take 20 mg by mouth daily.     Marland Kitchen  guaiFENesin (MUCINEX) 600 MG 12 hr tablet  Take 1,200 mg by mouth 2 (two) times daily.     Marland Kitchen  omeprazole (PRILOSEC) 40 MG capsule  Take 40 mg by mouth daily.     .  pantoprazole (PROTONIX) 40 MG tablet  Take 40 mg by mouth daily.     Marland Kitchen  Respiratory Therapy Supplies (PULMO-AIDE COMP/PULMO-NEB DISP) DEVI  by Does not apply route.     .  rosuvastatin (CRESTOR) 5 MG tablet  Take 5 mg by mouth daily.      REVIEW OF SYSTEMS:  Cardiovascular: No chest pain, chest pressure, palpitations, orthopnea, or dyspnea on exertion. No claudication or rest pain, No history of DVT or phlebitis.  Pulmonary: No productive cough, asthma or wheezing.  Neurologic: No weakness, paresthesias, aphasia, or amaurosis. No dizziness.  Hematologic: No bleeding problems or clotting disorders.  Musculoskeletal: No joint pain or joint swelling.  Gastrointestinal: No blood in stool or hematemesis  Genitourinary: No dysuria or hematuria.  Psychiatric:: No history of major depression.  Integumentary: No rashes or ulcers.  Constitutional: No fever or chills.  PHYSICAL  EXAMINATION:  General: The patient appears their stated age. Vital signs are BP 184/84  Pulse 73  Resp 16  Ht 5\' 3"  (1.6 m)  Wt 96 lb (43.545 kg)  BMI 17.01 kg/m2  SpO2 97%  HEENT: No gross abnormalities  Pulmonary: Respirations are non-labored  Abdomen: Soft and non-tender nontender aorta  Musculoskeletal: There are no major deformities.  Neurologic: No focal weakness or paresthesias are detected,  Skin: There are no ulcer or rashes noted.  Psychiatric: The patient has normal affect.  Cardiovascular: There is a regular rate and rhythm without significant murmur appreciated. No carotid bruits palpable pedal pulses bilaterally  Diagnostic Studies:  CT angiogram reveals a 6.5 cm infrarenal abdominal aortic aneurysm  Medication Changes:  None  Assessment:  Large abdominal aortic aneurysm  Plan:  The patient is here today with her niece. We discussed our options at this time. I feel that she is relatively high-risk for rupture given the size of her aneurysm. However, with her age and other comorbidities we discussed continued observation. The patient states that she is here today because she wishes to get this fixed. Based on my review of the images I feel this can be done using a endovascular prosthesis. Most likely this can be done percutaneously. I discussed the risks and benefits of proceeding with surgery including the risk of intestinal ischemia cardiopulmonary complications, stroke, lower extremity embolization as well as bleeding. They wished to proceed. Because the patient is on home oxygen I feel like we can do this under local anesthesia with mild sedation. It is been scheduled for Friday, February 22 using a Gore Excluder endoprosthesis. She reportedly had a stress test done recently that was negative    Results for TARIKA, MCKETHAN (MRN [id number]) as of 11/01/2011 15:35  Ref. Range 10/25/2011 15:01  WBC Latest Range: 4.0-10.5 K/uL 9.0  RBC Latest Range: 3.87-5.11 MIL/uL 3.29 (L)    HGB Latest Range: 12.0-15.0 g/dL 8.5 (L)  HCT Latest Range: 36.0-46.0 % 27.3 (L)  Platelets Latest Range: 150-400 K/uL 255   Results for SHANELE, NISSAN (MRN [id number]) as of 11/01/2011 15:35  Ref. Range 10/25/2011  15:01  Sodium Latest Range: 135-145 mEq/L 134 (L)  Potassium Latest Range: 3.5-5.1 mEq/L 4.2  Chloride Latest Range: 96-112 mEq/L 103  CO2 Latest Range: 19-32 mEq/L 22  BUN Latest Range: 6-23 mg/dL 32 (H)  Creat Latest Range: 0.50-1.10 mg/dL 1.32  Calcium Latest Range: 8.4-10.5 mg/dL 9.2  GFR calc non Af Amer Latest Range: >90 mL/min 52 (L)  GFR calc Af Amer Latest Range: >90 mL/min 60 (L)  Glucose Latest Range: 70-99 mg/dL 440 (H)  Alkaline Phosphatase Latest Range: 39-117 U/L 84  Albumin Latest Range: 3.5-5.2 g/dL 2.9 (L)  AST Latest Range: 0-37 U/L 14  ALT Latest Range: 0-35 U/L 6  Total Protein Latest Range: 6.0-8.3 g/dL 7.7  Total Bilirubin Latest Range: 0.3-1.2 mg/dL 0.2 (L)   Results for MARIADELALUZ, GUGGENHEIM (MRN [id number]) as of 11/01/2011 15:35  Ref. Range 10/25/2011 15:01  Sample type No range found ARTERIAL DRAW  FIO2 No range found 0.21  pH, Arterial Latest Range: 7.350-7.400  7.409 (H)  pCO2 arterial Latest Range: 35.0-45.0 mmHg 35.1  pO2, Arterial Latest Range: 80.0-100.0 mmHg 115.0 (H)  Bicarbonate Latest Range: 20.0-24.0 mEq/L 21.7  TCO2 Latest Range: 0-100 mmol/L 22.8  Acid-base deficit Latest Range: 0.0-2.0 mmol/L 2.2 (H)  O2 Saturation No range found 99.0  Patient temperature No range found 98.6

## 2011-11-01 NOTE — Progress Notes (Signed)
Addended by: Marlowe Shores on: 11/01/2011 03:41 PM   Modules accepted: Orders

## 2011-11-02 ENCOUNTER — Inpatient Hospital Stay (HOSPITAL_COMMUNITY): Payer: Medicare Other | Admitting: Anesthesiology

## 2011-11-02 ENCOUNTER — Inpatient Hospital Stay (HOSPITAL_COMMUNITY): Payer: Medicare Other | Admitting: Vascular Surgery

## 2011-11-02 ENCOUNTER — Encounter (HOSPITAL_COMMUNITY)
Admission: RE | Disposition: A | Discharge status: Skilled Nursing Facility | Payer: Self-pay | Source: Ambulatory Visit | Attending: Surgery

## 2011-11-02 ENCOUNTER — Inpatient Hospital Stay (HOSPITAL_COMMUNITY): Payer: Medicare Other

## 2011-11-02 ENCOUNTER — Other Ambulatory Visit: Payer: Self-pay | Admitting: Surgery

## 2011-11-02 ENCOUNTER — Encounter (HOSPITAL_COMMUNITY): Payer: Self-pay | Admitting: Vascular Surgery

## 2011-11-02 ENCOUNTER — Encounter (HOSPITAL_COMMUNITY): Payer: Self-pay | Admitting: Anesthesiology

## 2011-11-02 DIAGNOSIS — I743 Embolism and thrombosis of arteries of the lower extremities: Secondary | ICD-10-CM

## 2011-11-02 DIAGNOSIS — M79A29 Nontraumatic compartment syndrome of unspecified lower extremity: Secondary | ICD-10-CM

## 2011-11-02 LAB — BASIC METABOLIC PANEL
BUN: 25 mg/dL — ABNORMAL HIGH (ref 6–23)
CO2: 21 mEq/L (ref 19–32)
Calcium: 7.8 mg/dL — ABNORMAL LOW (ref 8.4–10.5)
GFR calc non Af Amer: 75 mL/min — ABNORMAL LOW (ref >90)
Glucose, Bld: 221 mg/dL — ABNORMAL HIGH (ref 70–99)
Sodium: 131 mEq/L — ABNORMAL LOW (ref 135–145)

## 2011-11-02 LAB — CBC
HCT: 15.3 % — ABNORMAL LOW (ref 36.0–46.0)
Hemoglobin: 4.9 g/dL — CL (ref 12.0–15.0)
MCH: 25.8 pg — ABNORMAL LOW (ref 26.0–34.0)
MCH: 27.6 pg (ref 26.0–34.0)
MCHC: 32 g/dL (ref 30.0–36.0)
MCHC: 33.7 g/dL (ref 30.0–36.0)
Platelets: 125 10*3/uL — ABNORMAL LOW (ref 150–400)
RBC: 1.9 MIL/uL — ABNORMAL LOW (ref 3.87–5.11)
RDW: 16.1 % — ABNORMAL HIGH (ref 11.5–15.5)

## 2011-11-02 LAB — PROTIME-INR: INR: 1.63 — ABNORMAL HIGH (ref 0.00–1.49)

## 2011-11-02 LAB — SURGICAL PCR SCREEN
MRSA, PCR: NEGATIVE
Staphylococcus aureus: NEGATIVE

## 2011-11-02 LAB — GLUCOSE, CAPILLARY
Glucose-Capillary: 140 mg/dL — ABNORMAL HIGH (ref 70–99)
Glucose-Capillary: 113 mg/dL — ABNORMAL HIGH (ref 70–99)

## 2011-11-02 LAB — APTT: aPTT: 36 seconds (ref 24–37)

## 2011-11-02 LAB — POCT I-STAT 4, (NA,K, GLUC, HGB,HCT)
Potassium: 3.9 mEq/L (ref 3.5–5.1)
Sodium: 137 mEq/L (ref 135–145)

## 2011-11-02 SURGERY — ENDARTERECTOMY, FEMORAL
Anesthesia: Monitor Anesthesia Care | Site: Leg Upper

## 2011-11-02 SURGERY — INSERTION, ENDOVASCULAR STENT GRAFT, AORTA, ABDOMINAL
Anesthesia: Monitor Anesthesia Care | Wound class: Clean

## 2011-11-02 MED ORDER — OXYCODONE-ACETAMINOPHEN 5-325 MG PO TABS: 1.0000 | ORAL_TABLET | ORAL | Status: AC | PRN

## 2011-11-02 MED ORDER — LACTATED RINGERS IV SOLN
INTRAVENOUS | Status: DC | PRN
  Administered 2011-11-02: 07:00:00 via INTRAVENOUS

## 2011-11-02 MED ORDER — ONDANSETRON HCL 4 MG/2ML IJ SOLN: 4.0000 mg | Freq: Four times a day (QID) | INTRAMUSCULAR | Status: DC | PRN

## 2011-11-02 MED ORDER — FENTANYL CITRATE 0.05 MG/ML IJ SOLN
INTRAMUSCULAR | Status: AC
  Administered 2011-11-02: 25 ug via INTRAVENOUS
  Filled 2011-11-02: qty 2

## 2011-11-02 MED ORDER — PROPOFOL 10 MG/ML IV EMUL
INTRAVENOUS | Status: DC | PRN
  Administered 2011-11-02: 50 ug/kg/min via INTRAVENOUS

## 2011-11-02 MED ORDER — 0.9 % SODIUM CHLORIDE (POUR BTL) OPTIME
TOPICAL | Status: DC | PRN
  Administered 2011-11-02: 2000 mL

## 2011-11-02 MED ORDER — HYDROMORPHONE HCL PF 1 MG/ML IJ SOLN
0.5000 mg | INTRAMUSCULAR | Status: DC | PRN
  Administered 2011-11-06: 0.5 mg via INTRAVENOUS
  Administered 2011-11-06 – 2011-11-07 (×2): 1 mg via INTRAVENOUS
  Filled 2011-11-02 (×3): qty 1

## 2011-11-02 MED ORDER — DOPAMINE-DEXTROSE 3.2-5 MG/ML-% IV SOLN: 3.0000 ug/kg/min | INTRAVENOUS | Status: DC

## 2011-11-02 MED ORDER — OXYCODONE-ACETAMINOPHEN 5-325 MG PO TABS
1.0000 | ORAL_TABLET | ORAL | Status: DC | PRN
  Administered 2011-11-03 – 2011-11-07 (×8): 1 via ORAL
  Administered 2011-11-08 – 2011-11-09 (×2): 2 via ORAL
  Administered 2011-11-10 – 2011-11-13 (×5): 1 via ORAL
  Filled 2011-11-02: qty 1
  Filled 2011-11-02 (×2): qty 2
  Filled 2011-11-02 (×3): qty 1
  Filled 2011-11-02: qty 2
  Filled 2011-11-02 (×9): qty 1

## 2011-11-02 MED ORDER — MORPHINE SULFATE 2 MG/ML IJ SOLN: 0.0500 mg/kg | INTRAMUSCULAR | Status: DC | PRN

## 2011-11-02 MED ORDER — IODIXANOL 320 MG/ML IV SOLN
INTRAVENOUS | Status: DC | PRN
  Administered 2011-11-02: 150 mL via INTRAVENOUS

## 2011-11-02 MED ORDER — HETASTARCH-ELECTROLYTES 6 % IV SOLN
INTRAVENOUS | Status: DC | PRN
  Administered 2011-11-02: 11:00:00 via INTRAVENOUS

## 2011-11-02 MED ORDER — CHLORHEXIDINE GLUCONATE 4 % EX LIQD
Freq: Once | CUTANEOUS | Status: AC
  Administered 2011-11-02: 04:00:00 via TOPICAL
  Filled 2011-11-02: qty 15

## 2011-11-02 MED ORDER — PROPOFOL 10 MG/ML IV EMUL
INTRAVENOUS | Status: DC | PRN
  Administered 2011-11-02: 25 ug/kg/min via INTRAVENOUS

## 2011-11-02 MED ORDER — 0.9 % SODIUM CHLORIDE (POUR BTL) OPTIME
TOPICAL | Status: DC | PRN
  Administered 2011-11-02: 200 mL

## 2011-11-02 MED ORDER — PHENOL 1.4 % MT LIQD: 1.0000 | OROMUCOSAL | Status: DC | PRN

## 2011-11-02 MED ORDER — FENTANYL CITRATE 0.05 MG/ML IJ SOLN
INTRAMUSCULAR | Status: DC | PRN
  Administered 2011-11-02 (×3): 25 ug via INTRAVENOUS
  Administered 2011-11-02: 50 ug via INTRAVENOUS
  Administered 2011-11-02 (×3): 25 ug via INTRAVENOUS

## 2011-11-02 MED ORDER — HEMOSTATIC AGENTS (NO CHARGE) OPTIME
TOPICAL | Status: DC | PRN
  Administered 2011-11-02: 1

## 2011-11-02 MED ORDER — HYDRALAZINE HCL 20 MG/ML IJ SOLN
10.0000 mg | INTRAMUSCULAR | Status: DC | PRN
  Administered 2011-11-06 – 2011-11-13 (×4): 10 mg via INTRAVENOUS
  Filled 2011-11-02 (×5): qty 0.5

## 2011-11-02 MED ORDER — SODIUM CHLORIDE 0.9 % IR SOLN
Status: DC | PRN
  Administered 2011-11-02: 16:00:00

## 2011-11-02 MED ORDER — METOPROLOL TARTRATE 25 MG PO TABS: 25.0000 mg | ORAL_TABLET | Freq: Once | ORAL | Status: DC

## 2011-11-02 MED ORDER — METOPROLOL TARTRATE 1 MG/ML IV SOLN
2.0000 mg | INTRAVENOUS | Status: DC | PRN
  Administered 2011-11-03: 5 mg via INTRAVENOUS
  Filled 2011-11-02: qty 5

## 2011-11-02 MED ORDER — ACETAMINOPHEN 325 MG PO TABS: 325.0000 mg | ORAL_TABLET | ORAL | Status: DC | PRN

## 2011-11-02 MED ORDER — METOPROLOL TARTRATE 1 MG/ML IV SOLN
INTRAVENOUS | Status: AC
  Administered 2011-11-02: 5 mg via INTRAVENOUS
  Filled 2011-11-02: qty 5

## 2011-11-02 MED ORDER — LIDOCAINE HCL 1 % IJ SOLN
INTRAMUSCULAR | Status: DC | PRN
  Administered 2011-11-02: 20 mL

## 2011-11-02 MED ORDER — DEXTROSE 5 % IV SOLN
1.5000 g | Freq: Two times a day (BID) | INTRAVENOUS | Status: AC
  Administered 2011-11-02 – 2011-11-03 (×2): 1.5 g via INTRAVENOUS
  Filled 2011-11-02 (×3): qty 1.5

## 2011-11-02 MED ORDER — HYDROMORPHONE HCL PF 1 MG/ML IJ SOLN
0.2500 mg | INTRAMUSCULAR | Status: AC | PRN
  Administered 2011-11-02 (×8): 0.25 mg via INTRAVENOUS

## 2011-11-02 MED ORDER — LABETALOL HCL 5 MG/ML IV SOLN
INTRAVENOUS | Status: DC | PRN
  Administered 2011-11-02: 5 mg via INTRAVENOUS
  Administered 2011-11-02 (×4): 2.5 mg via INTRAVENOUS
  Administered 2011-11-02: 5 mg via INTRAVENOUS

## 2011-11-02 MED ORDER — LABETALOL HCL 5 MG/ML IV SOLN
10.0000 mg | INTRAVENOUS | Status: DC | PRN
  Administered 2011-11-02 (×2): 10 mg via INTRAVENOUS
  Filled 2011-11-02: qty 4

## 2011-11-02 MED ORDER — ACETAMINOPHEN 650 MG RE SUPP: 325.0000 mg | RECTAL | Status: DC | PRN

## 2011-11-02 MED ORDER — HYDRALAZINE HCL 20 MG/ML IJ SOLN
10.0000 mg | INTRAMUSCULAR | Status: AC
  Administered 2011-11-02: 10 mg via INTRAVENOUS
  Filled 2011-11-02: qty 0.5

## 2011-11-02 MED ORDER — GUAIFENESIN-DM 100-10 MG/5ML PO SYRP: 15.0000 mL | ORAL_SOLUTION | ORAL | Status: DC | PRN

## 2011-11-02 MED ORDER — MAGNESIUM SULFATE 40 MG/ML IJ SOLN
2.0000 g | Freq: Once | INTRAMUSCULAR | Status: AC | PRN
  Filled 2011-11-02: qty 50

## 2011-11-02 MED ORDER — SODIUM CHLORIDE 0.9 % IV SOLN: 500.0000 mL | Freq: Once | INTRAVENOUS | Status: AC | PRN

## 2011-11-02 MED ORDER — IODIXANOL 320 MG/ML IV SOLN
INTRAVENOUS | Status: DC | PRN
  Administered 2011-11-02: 50 mL via INTRAVENOUS

## 2011-11-02 MED ORDER — FENTANYL CITRATE 0.05 MG/ML IJ SOLN
25.0000 ug | INTRAMUSCULAR | Status: DC | PRN
  Administered 2011-11-02 (×2): 25 ug via INTRAVENOUS

## 2011-11-02 MED ORDER — HYDROMORPHONE HCL PF 1 MG/ML IJ SOLN
INTRAMUSCULAR | Status: AC
  Administered 2011-11-02: 0.25 mg via INTRAVENOUS
  Filled 2011-11-02: qty 1

## 2011-11-02 MED ORDER — HEPARIN SODIUM (PORCINE) 1000 UNIT/ML IJ SOLN
INTRAMUSCULAR | Status: DC | PRN
  Administered 2011-11-02: 5000 [IU] via INTRAVENOUS

## 2011-11-02 MED ORDER — SODIUM CHLORIDE 0.9 % IV SOLN
INTRAVENOUS | Status: DC | PRN
  Administered 2011-11-02: 15:00:00 via INTRAVENOUS

## 2011-11-02 MED ORDER — ONDANSETRON HCL 4 MG/2ML IJ SOLN: 4.0000 mg | Freq: Once | INTRAMUSCULAR | Status: DC | PRN

## 2011-11-02 MED ORDER — DOCUSATE SODIUM 100 MG PO CAPS
100.0000 mg | ORAL_CAPSULE | Freq: Every day | ORAL | Status: DC
  Administered 2011-11-03 – 2011-11-13 (×11): 100 mg via ORAL
  Filled 2011-11-02 (×11): qty 1

## 2011-11-02 MED ORDER — METOCLOPRAMIDE HCL 5 MG/ML IJ SOLN: 10.0000 mg | Freq: Once | INTRAMUSCULAR | Status: DC | PRN

## 2011-11-02 MED ORDER — FAMOTIDINE 20 MG PO TABS
20.0000 mg | ORAL_TABLET | Freq: Two times a day (BID) | ORAL | Status: DC
  Administered 2011-11-03 – 2011-11-06 (×7): 20 mg via ORAL
  Filled 2011-11-02 (×8): qty 1

## 2011-11-02 MED ORDER — HEPARIN SODIUM (PORCINE) 1000 UNIT/ML IJ SOLN
INTRAMUSCULAR | Status: DC | PRN
  Administered 2011-11-02: 5000 [IU] via INTRAVENOUS
  Administered 2011-11-02: 1000 [IU] via INTRAVENOUS
  Administered 2011-11-02: 1500 [IU] via INTRAVENOUS

## 2011-11-02 MED ORDER — SODIUM CHLORIDE 0.9 % IR SOLN
Status: DC | PRN
  Administered 2011-11-02: 09:00:00

## 2011-11-02 MED ORDER — POTASSIUM CHLORIDE CRYS ER 20 MEQ PO TBCR: 20.0000 meq | EXTENDED_RELEASE_TABLET | Freq: Once | ORAL | Status: AC | PRN

## 2011-11-02 MED ORDER — PROTAMINE SULFATE 10 MG/ML IV SOLN
INTRAVENOUS | Status: DC | PRN
  Administered 2011-11-02: 50 mg via INTRAVENOUS

## 2011-11-02 MED ORDER — SODIUM CHLORIDE 0.9 % IV SOLN
INTRAVENOUS | Status: DC | PRN
  Administered 2011-11-02: 16:00:00 via INTRAVENOUS

## 2011-11-02 MED ORDER — DEXTROSE-NACL 5-0.9 % IV SOLN
INTRAVENOUS | Status: DC
  Administered 2011-11-02 – 2011-11-04 (×2): via INTRAVENOUS
  Administered 2011-11-05: 75 mL/h via INTRAVENOUS
  Administered 2011-11-07 – 2011-11-09 (×5): via INTRAVENOUS

## 2011-11-02 MED ORDER — ASPIRIN 81 MG PO CHEW
81.0000 mg | CHEWABLE_TABLET | Freq: Every day | ORAL | Status: DC
  Administered 2011-11-03 – 2011-11-13 (×11): 81 mg via ORAL
  Filled 2011-11-02 (×11): qty 1

## 2011-11-02 SURGICAL SUPPLY — 72 items
BAG ISOLATION DRAPE 18X18 (DRAPES) ×2 IMPLANT
BANDAGE ELASTIC 4 VELCRO ST LF (GAUZE/BANDAGES/DRESSINGS) IMPLANT
BANDAGE ESMARK 6X9 LF (GAUZE/BANDAGES/DRESSINGS) IMPLANT
BNDG ESMARK 6X9 LF (GAUZE/BANDAGES/DRESSINGS)
CANISTER SUCTION 2500CC (MISCELLANEOUS) ×3 IMPLANT
CATH EMB 3FR 80CM (CATHETERS) ×3 IMPLANT
CATH EMB 4FR 80CM (CATHETERS) ×3 IMPLANT
CATH EMB 5FR 80CM (CATHETERS) ×3 IMPLANT
CLIP TI MEDIUM 24 (CLIP) ×3 IMPLANT
CLIP TI WIDE RED SMALL 24 (CLIP) ×3 IMPLANT
CLOTH BEACON ORANGE TIMEOUT ST (SAFETY) ×3 IMPLANT
COVER SURGICAL LIGHT HANDLE (MISCELLANEOUS) ×6 IMPLANT
CUFF TOURNIQUET SINGLE 24IN (TOURNIQUET CUFF) IMPLANT
CUFF TOURNIQUET SINGLE 34IN LL (TOURNIQUET CUFF) IMPLANT
CUFF TOURNIQUET SINGLE 44IN (TOURNIQUET CUFF) IMPLANT
DERMABOND ADHESIVE PROPEN ×3 IMPLANT
DERMABOND ADVANCED (GAUZE/BANDAGES/DRESSINGS) ×1
DERMABOND ADVANCED .7 DNX12 (GAUZE/BANDAGES/DRESSINGS) ×2 IMPLANT
DRAIN CHANNEL 15F RND FF W/TCR (WOUND CARE) IMPLANT
DRAPE C-ARM 42X72 X-RAY (DRAPES) ×3 IMPLANT
DRAPE ISOLATION BAG 18X18 (DRAPES) ×1
DRAPE WARM FLUID 44X44 (DRAPE) ×3 IMPLANT
DRAPE X-RAY CASS 24X20 (DRAPES) IMPLANT
DRSG COVADERM 4X10 (GAUZE/BANDAGES/DRESSINGS) IMPLANT
DRSG COVADERM 4X8 (GAUZE/BANDAGES/DRESSINGS) IMPLANT
ELECT REM PT RETURN 9FT ADLT (ELECTROSURGICAL) ×3
ELECTRODE REM PT RTRN 9FT ADLT (ELECTROSURGICAL) ×2 IMPLANT
EVACUATOR SILICONE 100CC (DRAIN) IMPLANT
GLOVE BIO SURGEON STRL SZ 6.5 (GLOVE) ×6 IMPLANT
GLOVE BIO SURGEON STRL SZ7 (GLOVE) ×3 IMPLANT
GLOVE BIOGEL PI IND STRL 6.5 (GLOVE) ×2 IMPLANT
GLOVE BIOGEL PI IND STRL 7.5 (GLOVE) ×2 IMPLANT
GLOVE BIOGEL PI INDICATOR 6.5 (GLOVE) ×1
GLOVE BIOGEL PI INDICATOR 7.5 (GLOVE) ×1
GLOVE SURG SS PI 7.5 STRL IVOR (GLOVE) ×3 IMPLANT
GOWN PREVENTION PLUS XXLARGE (GOWN DISPOSABLE) ×3 IMPLANT
GOWN STRL NON-REIN LRG LVL3 (GOWN DISPOSABLE) ×9 IMPLANT
HEMOSTAT SNOW SURGICEL 2X4 (HEMOSTASIS) ×3 IMPLANT
HEMOSTAT SURGICEL 2X14 (HEMOSTASIS) IMPLANT
KIT BASIN OR (CUSTOM PROCEDURE TRAY) ×3 IMPLANT
KIT ROOM TURNOVER OR (KITS) ×3 IMPLANT
NS IRRIG 1000ML POUR BTL (IV SOLUTION) ×6 IMPLANT
PACK AORTA (CUSTOM PROCEDURE TRAY) ×3 IMPLANT
PAD ARMBOARD 7.5X6 YLW CONV (MISCELLANEOUS) ×6 IMPLANT
PADDING CAST COTTON 6X4 STRL (CAST SUPPLIES) IMPLANT
PATCH VASCULAR VASCU GUARD 1X6 (Vascular Products) ×3 IMPLANT
SET COLLECT BLD 21X3/4 12 (NEEDLE) IMPLANT
STAPLER VISISTAT 35W (STAPLE) IMPLANT
STOPCOCK 4 WAY LG BORE MALE ST (IV SETS) IMPLANT
STOPCOCK MORSE 400PSI 3WAY (MISCELLANEOUS) ×3 IMPLANT
SUT ETHILON 3 0 PS 1 (SUTURE) IMPLANT
SUT PROLENE 5 0 C 1 24 (SUTURE) IMPLANT
SUT PROLENE 6 0 BV (SUTURE) ×12 IMPLANT
SUT PROLENE 7 0 BV 1 (SUTURE) IMPLANT
SUT PROLENE 7 0 BV1 MDA (SUTURE) ×3 IMPLANT
SUT SILK 2 0 TIES 17X18 (SUTURE) ×1
SUT SILK 2-0 18XBRD TIE BLK (SUTURE) ×2 IMPLANT
SUT SILK 3 0 (SUTURE) ×1
SUT SILK 3-0 18XBRD TIE 12 (SUTURE) ×2 IMPLANT
SUT VIC AB 2-0 CT1 27 (SUTURE) ×1
SUT VIC AB 2-0 CT1 TAPERPNT 27 (SUTURE) ×2 IMPLANT
SUT VIC AB 3-0 SH 27 (SUTURE) ×1
SUT VIC AB 3-0 SH 27X BRD (SUTURE) ×2 IMPLANT
SUT VICRYL 4-0 PS2 18IN ABS (SUTURE) ×3 IMPLANT
SYR 3ML LL SCALE MARK (SYRINGE) ×6 IMPLANT
SYR TB 1ML LUER SLIP (SYRINGE) ×3 IMPLANT
TOWEL OR 17X24 6PK STRL BLUE (TOWEL DISPOSABLE) ×6 IMPLANT
TOWEL OR 17X26 10 PK STRL BLUE (TOWEL DISPOSABLE) ×6 IMPLANT
TRAY FOLEY CATH 14FRSI W/METER (CATHETERS) ×3 IMPLANT
TUBING EXTENTION W/L.L. (IV SETS) IMPLANT
UNDERPAD 30X30 INCONTINENT (UNDERPADS AND DIAPERS) ×3 IMPLANT
WATER STERILE IRR 1000ML POUR (IV SOLUTION) ×3 IMPLANT

## 2011-11-02 SURGICAL SUPPLY — 85 items
8620 ×2 IMPLANT
BAG DECANTER FOR FLEXI CONT (MISCELLANEOUS) ×2 IMPLANT
BAG SNAP BAND KOVER 36X36 (MISCELLANEOUS) ×2 IMPLANT
BALLN CODA OCL 2-9.0-35-120-3 (BALLOONS)
BALLOON COD OCL 2-9.0-35-120-3 (BALLOONS) IMPLANT
CANISTER SUCTION 2500CC (MISCELLANEOUS) ×2 IMPLANT
CATH OMNI FLUSH .035X70CM (CATHETERS) ×2 IMPLANT
CLIP TI MEDIUM 24 (CLIP) IMPLANT
CLIP TI WIDE RED SMALL 24 (CLIP) IMPLANT
CLOTH BEACON ORANGE TIMEOUT ST (SAFETY) ×2 IMPLANT
COVER MAYO STAND STRL (DRAPES) ×2 IMPLANT
COVER PROBE W GEL 5X96 (DRAPES) ×4 IMPLANT
COVER SURGICAL LIGHT HANDLE (MISCELLANEOUS) ×4 IMPLANT
DERMABOND ADHESIVE PROPEN (GAUZE/BANDAGES/DRESSINGS) ×1
DERMABOND ADVANCED (GAUZE/BANDAGES/DRESSINGS) ×1
DERMABOND ADVANCED .7 DNX12 (GAUZE/BANDAGES/DRESSINGS) ×1 IMPLANT
DERMABOND ADVANCED .7 DNX6 (GAUZE/BANDAGES/DRESSINGS) ×1 IMPLANT
DEVICE CLOSURE PERCLS PRGLD 6F (VASCULAR PRODUCTS) ×4 IMPLANT
DEVICE TORQUE 50000 (MISCELLANEOUS) IMPLANT
DRAIN CHANNEL 10F 3/8 F FF (DRAIN) IMPLANT
DRAIN CHANNEL 10M FLAT 3/4 FLT (DRAIN) IMPLANT
DRAPE C-ARM 42X72 X-RAY (DRAPES) ×2 IMPLANT
DRAPE TABLE COVER HEAVY DUTY (DRAPES) ×2 IMPLANT
DRYSEAL FLEXSHEATH 18FR 33CM (SHEATH) ×1
ELECT REM PT RETURN 9FT ADLT (ELECTROSURGICAL) ×4
ELECTRODE REM PT RTRN 9FT ADLT (ELECTROSURGICAL) ×2 IMPLANT
EVACUATOR 3/16  PVC DRAIN (DRAIN)
EVACUATOR 3/16 PVC DRAIN (DRAIN) IMPLANT
EVACUATOR SILICONE 100CC (DRAIN) IMPLANT
GAUZE SPONGE 4X4 16PLY XRAY LF (GAUZE/BANDAGES/DRESSINGS) ×2 IMPLANT
GLOVE BIO SURGEON STRL SZ 6.5 (GLOVE) ×4 IMPLANT
GLOVE BIOGEL PI IND STRL 6.5 (GLOVE) ×2 IMPLANT
GLOVE BIOGEL PI IND STRL 7.0 (GLOVE) ×1 IMPLANT
GLOVE BIOGEL PI IND STRL 7.5 (GLOVE) ×2 IMPLANT
GLOVE BIOGEL PI INDICATOR 6.5 (GLOVE) ×2
GLOVE BIOGEL PI INDICATOR 7.0 (GLOVE) ×1
GLOVE BIOGEL PI INDICATOR 7.5 (GLOVE) ×2
GLOVE SURG SS PI 7.0 STRL IVOR (GLOVE) ×2 IMPLANT
GLOVE SURG SS PI 7.5 STRL IVOR (GLOVE) ×2 IMPLANT
GOWN PREVENTION PLUS XLARGE (GOWN DISPOSABLE) ×2 IMPLANT
GOWN PREVENTION PLUS XXLARGE (GOWN DISPOSABLE) ×4 IMPLANT
GOWN STRL NON-REIN LRG LVL3 (GOWN DISPOSABLE) ×4 IMPLANT
GRAFT BALLN CATH 65CM (STENTS) ×1 IMPLANT
GRAFT ENDOPROSETHESIS 28/12/16 (Endovascular Graft) ×2 IMPLANT
GRAFT EXCLUDER LEG 14.5X12 (Endovascular Graft) ×2 IMPLANT
HEMOSTAT SNOW SURGICEL 2X4 (HEMOSTASIS) IMPLANT
HEMOSTAT SURGICEL 2X14 (HEMOSTASIS) IMPLANT
INTRODUCER AVANTI 6FR (MISCELLANEOUS) ×2 IMPLANT
KIT BASIN OR (CUSTOM PROCEDURE TRAY) ×2 IMPLANT
KIT ENCORE 26 ADVANTAGE (KITS) ×2 IMPLANT
KIT ROOM TURNOVER OR (KITS) ×2 IMPLANT
NEEDLE PERC 18GX7CM (NEEDLE) ×2 IMPLANT
NS IRRIG 1000ML POUR BTL (IV SOLUTION) ×2 IMPLANT
PACK AORTA (CUSTOM PROCEDURE TRAY) ×2 IMPLANT
PAD ARMBOARD 7.5X6 YLW CONV (MISCELLANEOUS) ×4 IMPLANT
PENCIL BUTTON HOLSTER BLD 10FT (ELECTRODE) IMPLANT
PERCLOSE PROGLIDE 6F (VASCULAR PRODUCTS) ×8
SET DILATOR ENDOVASCULAR (SET/KITS/TRAYS/PACK) ×2 IMPLANT
SET DILATOR ENDOVASCULAR 16FR (SET/KITS/TRAYS/PACK) ×2 IMPLANT
SHEATH AVANTI 11CM 8FR (MISCELLANEOUS) ×2 IMPLANT
SHEATH BRITE TIP 8FR 23CM (MISCELLANEOUS) ×2 IMPLANT
SHEATH DRYSEAL FLEX 18FR 33CM (SHEATH) ×1 IMPLANT
SHEATH DRYSEAL GORE 12FRX28 (SHEATH) ×2 IMPLANT
STAPLER VISISTAT 35W (STAPLE) IMPLANT
STENT GRAFT BALLN CATH 65CM (STENTS) ×1
STOPCOCK MORSE 400PSI 3WAY (MISCELLANEOUS) ×2 IMPLANT
SUT ETHILON 3 0 PS 1 (SUTURE) IMPLANT
SUT PROLENE 5 0 C 1 24 (SUTURE) IMPLANT
SUT VIC AB 2-0 CT1 36 (SUTURE) IMPLANT
SUT VIC AB 3-0 SH 27 (SUTURE)
SUT VIC AB 3-0 SH 27X BRD (SUTURE) IMPLANT
SUT VICRYL 4-0 PS2 18IN ABS (SUTURE) ×4 IMPLANT
SYR 20CC LL (SYRINGE) ×4 IMPLANT
SYR 30ML LL (SYRINGE) IMPLANT
SYR 5ML LL (SYRINGE) IMPLANT
SYR MEDRAD MARK V 150ML (SYRINGE) ×4 IMPLANT
SYRINGE 10CC LL (SYRINGE) ×6 IMPLANT
TOWEL OR 17X24 6PK STRL BLUE (TOWEL DISPOSABLE) ×4 IMPLANT
TOWEL OR 17X26 10 PK STRL BLUE (TOWEL DISPOSABLE) ×4 IMPLANT
TRAY FOLEY CATH 14FRSI W/METER (CATHETERS) ×2 IMPLANT
TUBING HIGH PRESSURE 120CM (CONNECTOR) ×4 IMPLANT
WATER STERILE IRR 1000ML POUR (IV SOLUTION) ×2 IMPLANT
WIRE AMPLATZ SS-J .035X180CM (WIRE) ×4 IMPLANT
WIRE BENTSON .035X145CM (WIRE) ×4 IMPLANT
WIRE LUNDERQUIST .035X180CM (WIRE) ×4 IMPLANT

## 2011-11-02 NOTE — Progress Notes (Signed)
Attempted a call to Esperanza Sheets, pts son, at [phone] for consent.  Left a VM.  Will try again.

## 2011-11-02 NOTE — H&P (View-Only) (Signed)
[facility]   Patient name: Lisa Nichols MRN: [id number] DOB: Jan 20, 1924 Sex: female   Referred by: Dr Sherril Croon  Reason for referral:  Chief Complaint  Patient presents with  . AAA    Eval of AAA per Dr. Sherril Croon     HISTORY OF PRESENT ILLNESS: This is a very pleasant 78 year old female that I had seen for evaluation of an abdominal aortic aneurysm. The patient was recently hospitalized in [place] for shortness of breath and hypoxia. The most likely diagnosis was heart failure. She is now on 2 L of oxygen. During her hospitalization she was found to have an abdominal aortic aneurysm measuring 6.5 cm in maximum diameter she initially refused to be seen by vascular surgeons because she did not want this repaired however has subsequently changed her mind. She denies having any abdominal pain or back pain at this time.  The patient has a history of type 2 diabetes which is medically managed. She is known carotid artery stenosis. The most recent ultrasound I confined shows 40-59% stenosis. She has COPD and is on 2 L of home oxygen, continuously. She is also a greater than 50-pack-year history of smoking.  Past Medical History  Diagnosis Date  . CAD (coronary artery disease)   . Anemia   . AAA (abdominal aortic aneurysm)   . COPD (chronic obstructive pulmonary disease)   . History of syncope   . Compression fracture of thoracic vertebra 2008    T7- T8  . Cholelithiases   . Carotid artery occlusion   . Diverticulosis of colon   . Diabetes mellitus   . Hypertension   . Hiatal hernia   . CHF (congestive heart failure)   . Kyphosis     Past Surgical History  Procedure Date  . Spine surgery     kyphoplasty  . Cardiac catheterization 2009    History   Social History  . Marital Status: Widowed    Spouse Name: N/A    Number of Children: N/A  . Years of Education: N/A   Occupational History  . Not on file.   Social History Main Topics  . Smoking status:  Former Smoker    Quit date: 10/15/2007  . Smokeless tobacco: Not on file  . Alcohol Use: No  . Drug Use: No  . Sexually Active:    Other Topics Concern  . Not on file   Social History Narrative  . No narrative on file    Family History  Problem Relation Age of Onset  . Heart disease Sister   . Heart disease Brother     Allergies as of 10/15/2011 - Review Complete 10/15/2011  Allergen Reaction Noted  . Barbiturates  09/14/2011  . Phenobarbital  09/14/2011    Current Outpatient Prescriptions on File Prior to Visit  Medication Sig Dispense Refill  . albuterol (ACCUNEB) 1.25 MG/3ML nebulizer solution Take 1 ampule by nebulization every 6 (six) hours as needed.        Marland Kitchen aspirin 81 MG tablet Take 160 mg by mouth daily.        . metoprolol tartrate (LOPRESSOR) 25 MG tablet Take 25 mg by mouth 2 (two) times daily.        . nitroGLYCERIN (NITROSTAT) 0.4 MG SL tablet Place 0.4 mg under the tongue every 5 (five) minutes as needed.        . citalopram (CELEXA) 20 MG tablet Take 20 mg by mouth daily.        Marland Kitchen  guaiFENesin (MUCINEX) 600 MG 12 hr tablet Take 1,200 mg by mouth 2 (two) times daily.        Marland Kitchen omeprazole (PRILOSEC) 40 MG capsule Take 40 mg by mouth daily.        . pantoprazole (PROTONIX) 40 MG tablet Take 40 mg by mouth daily.        Marland Kitchen Respiratory Therapy Supplies (PULMO-AIDE COMP/PULMO-NEB DISP) DEVI by Does not apply route.        . rosuvastatin (CRESTOR) 5 MG tablet Take 5 mg by mouth daily.           REVIEW OF SYSTEMS: Cardiovascular: No chest pain, chest pressure, palpitations, orthopnea, or dyspnea on exertion. No claudication or rest pain,  No history of DVT or phlebitis. Pulmonary: No productive cough, asthma or wheezing. Neurologic: No weakness, paresthesias, aphasia, or amaurosis. No dizziness. Hematologic: No bleeding problems or clotting disorders. Musculoskeletal: No joint pain or joint swelling. Gastrointestinal: No blood in stool or  hematemesis Genitourinary: No dysuria or hematuria. Psychiatric:: No history of major depression. Integumentary: No rashes or ulcers. Constitutional: No fever or chills.  PHYSICAL EXAMINATION: General: The patient appears their stated age.  Vital signs are BP 184/84  Pulse 73  Resp 16  Ht 5\' 3"  (1.6 m)  Wt 96 lb (43.545 kg)  BMI 17.01 kg/m2  SpO2 97% HEENT:  No gross abnormalities Pulmonary: Respirations are non-labored  Abdomen: Soft and non-tender nontender aorta Musculoskeletal: There are no major deformities.   Neurologic: No focal weakness or paresthesias are detected, Skin: There are no ulcer or rashes noted. Psychiatric: The patient has normal affect. Cardiovascular: There is a regular rate and rhythm without significant murmur appreciated. No carotid bruits palpable pedal pulses bilaterally   Diagnostic Studies: CT angiogram reveals a 6.5 cm infrarenal abdominal aortic aneurysm    Medication Changes: None  Assessment:  Large abdominal aortic aneurysm Plan: The patient is here today with her knees. We discussed our options at this time. I feel that she is relatively high-risk for rupture given the size of her aneurysm. However, with her age and other comorbidities we discussed continued observation. The patient states that she is here today because she wishes to get this fixed. Based on my review of the images I feel this can be done using a endovascular prosthesis. Most likely this can be done percutaneously. I discussed the risks and benefits of proceeding with surgery including the risk of intestinal ischemia cardiopulmonary complications, stroke, lower extremity embolization as well as bleeding. They wished to proceed. Because the patient is on home oxygen I feel like we can do this under local anesthesia with mild sedation. It is been scheduled for Friday, February 22 using a Gore Excluder endoprosthesis. She reportedly had a stress test done recently that was  negative  Labs Results for SHAYANNE, GOMM (MRN [id number]) as of 11/01/2011 15:35  Ref. Range 12/19/2010 10:11 10/25/2011 15:01  Sample type No range found  ARTERIAL DRAW  FIO2 No range found  0.21  pH, Arterial Latest Range: 7.350-7.400   7.409 (H)  pCO2 arterial Latest Range: 35.0-45.0 mmHg  35.1  pO2, Arterial Latest Range: 80.0-100.0 mmHg  115.0 (H)  Bicarbonate Latest Range: 20.0-24.0 mEq/L  21.7  TCO2 Latest Range: 0-100 mmol/L 23 22.8  Acid-base deficit Latest Range: 0.0-2.0 mmol/L  2.2 (H)  O2 Saturation No range found  99.0  Patient temperature No range found  98.6  Collection site No range found  LEFT BRACHIAL  Allens test (pass/fail) Latest Range: PASS  PASS   Results for WANETA, FITTING (MRN [id number]) as of 11/01/2011 15:35  Ref. Range 10/25/2011 15:01  Sodium Latest Range: 135-145 mEq/L 134 (L)  Potassium Latest Range: 3.5-5.1 mEq/L 4.2  Chloride Latest Range: 96-112 mEq/L 103  CO2 Latest Range: 19-32 mEq/L 22  BUN Latest Range: 6-23 mg/dL 32 (H)  Creat Latest Range: 0.50-1.10 mg/dL 8.41  Calcium Latest Range: 8.4-10.5 mg/dL 9.2  GFR calc non Af Amer Latest Range: >90 mL/min 52 (L)  GFR calc Af Amer Latest Range: >90 mL/min 60 (L)  Glucose Latest Range: 70-99 mg/dL 324 (H)  Alkaline Phosphatase Latest Range: 39-117 U/L 84  Albumin Latest Range: 3.5-5.2 g/dL 2.9 (L)  AST Latest Range: 0-37 U/L 14  ALT Latest Range: 0-35 U/L 6  Total Protein Latest Range: 6.0-8.3 g/dL 7.7  Total Bilirubin Latest Range: 0.3-1.2 mg/dL 0.2 (L)  Results for TAKEELA, PEIL (MRN [id number]) as of 11/01/2011 15:35  Ref. Range 10/25/2011 15:01  WBC Latest Range: 4.0-10.5 K/uL 9.0  RBC Latest Range: 3.87-5.11 MIL/uL 3.29 (L)  HGB Latest Range: 12.0-15.0 g/dL 8.5 (L)  HCT Latest Range: 36.0-46.0 % 27.3 (L)  Platelets Latest Range: 150-400 K/uL 255    V. Charlena Cross, M.D. [facility] Office: [phone] Pager:  [phone]

## 2011-11-02 NOTE — Clinical Documentation Improvement (Signed)
Anemia Blood Loss Clarification  THIS DOCUMENT IS NOT A PERMANENT PART OF THE MEDICAL RECORD  RESPOND TO THE THIS QUERY, FOLLOW THE INSTRUCTIONS BELOW:  1. If needed, update documentation for the patient's encounter via the notes activity.  2. Access this query again and click edit on the In [employer].  3. After updating, or not, click F2 to complete all highlighted (required) fields concerning your review. Select "additional documentation in the medical record" OR "no additional documentation provided".  4. Click Sign note button.  5. The deficiency will fall out of your In Basket *Please let us know if you are not able to complete this workflow by phone or e-mail (listed below).        11/02/11  Dear Nancie Neas Marton Redwood  In an effort to better capture your patient's severity of illness, reflect appropriate length of stay and utilization of resources, a review of the patient medical record has revealed the following indicators.    Based on your clinical judgment, please clarify and document in a progress note and/or discharge summary the clinical condition associated with the following supporting information:   Possible Clinical Conditions?   " Expected Acute Blood Loss Anemia  " Acute Blood Loss Anemia  " Acute on chronic blood loss anemia  " Chronic blood loss anemia  " Precipitous drop in Hematocrit  " Other Condition________________  " Cannot Clinically Determine    Supporting Information:  Diagnostics: H&H on 2/22:   4.9/15.3  Transfusion:   2/22:   unit PRBC IV fluids / plasma expanders: 2/22:  Hetastarch 6% in lactated electrolytes([place])  Reviewed:  Additional documentation noted in the progress notes.       Thank You,  Marciano Sequin,  Clinical Documentation Specialist:  Pager: [phone]  Health Information Management [place]

## 2011-11-02 NOTE — Anesthesia Postprocedure Evaluation (Signed)
Anesthesia Post Note  Patient: Lisa Nichols  Procedure(s) Performed: Procedure(s) (LRB): ENDARTERECTOMY FEMORAL (Left)  Anesthesia type: general  Patient location: PACU  Post pain: Pain level controlled  Post assessment: Patient's Cardiovascular Status Stable  Last Vitals:  Filed Vitals:   11/02/11 1900  BP: 150/63  Pulse: 78  Temp:   Resp: 13    Post vital signs: Reviewed and stable  Level of consciousness: sedated  Complications: No apparent anesthesia complications

## 2011-11-02 NOTE — Progress Notes (Signed)
Spoke with Esperanza Sheets, son, and explained events of Endovasular stent.  I explained that post op she lost pulses to her left leg and that [place] confirmed lack of Blood flow.  He was more than agreed to consent to possible thrombectomies and Femoral/Femoral Bypass.  Consent confirmed with Neurosurgeon.  Dr. Myra Gianotti and Dr. Katrinka Blazing notified.

## 2011-11-02 NOTE — Anesthesia Preprocedure Evaluation (Addendum)
Anesthesia Evaluation  Patient identified by MRN, date of birth, ID band Patient awake    Reviewed: Allergy & Precautions, H&P , NPO status , Patient's Chart, lab work & pertinent test results  History of Anesthesia Complications (+) AWARENESS UNDER ANESTHESIA  Airway Mallampati: I TM Distance: >3 FB Neck ROM: full    Dental  (+) Edentulous Upper, Edentulous Lower, Upper Dentures and Lower Dentures   Pulmonary shortness of breath, COPD         Cardiovascular hypertension, + CAD and +CHF regular Normal    Neuro/Psych  Neuromuscular disease    GI/Hepatic hiatal hernia,   Endo/Other  Diabetes mellitus-  Renal/GU      Musculoskeletal   Abdominal   Peds  Hematology   Anesthesia Other Findings   Reproductive/Obstetrics                          Anesthesia Physical Anesthesia Plan  ASA: III  Anesthesia Plan: MAC   Post-op Pain Management:    Induction: Intravenous  Airway Management Planned: Mask  Additional Equipment: Arterial line  Intra-op Plan:   Post-operative Plan:   Informed Consent:   Plan Discussed with: CRNA, Anesthesiologist and Surgeon  Anesthesia Plan Comments:         Anesthesia Quick Evaluation

## 2011-11-02 NOTE — Transfer of Care (Signed)
Immediate Anesthesia Transfer of Care Note  Patient: Lisa Nichols  Procedure(s) Performed: Procedure(s) (LRB): ENDARTERECTOMY FEMORAL (Left)  Patient Location: PACU  Anesthesia Type: MAC  Level of Consciousness: patient cooperative, lethargic and responds to stimulation  Airway & Oxygen Therapy: Patient Spontanous Breathing and Patient connected to nasal cannula oxygen  Post-op Assessment: Report given to PACU RN  Post vital signs: Reviewed and stable  Complications: No apparent anesthesia complications

## 2011-11-02 NOTE — Anesthesia Postprocedure Evaluation (Signed)
Anesthesia Post Note  Patient: Lisa Nichols  Procedure(s) Performed: Procedure(s) (LRB): ENDARTERECTOMY FEMORAL (Left)  Anesthesia type: general  Patient location: PACU  Post pain: Pain level controlled  Post assessment: Patient's Cardiovascular Status Stable  Last Vitals:  Filed Vitals:   11/02/11 1500  BP: 160/61  Pulse: 91  Temp: 36.8 C  Resp: 17    Post vital signs: Reviewed and stable  Level of consciousness: sedated  Complications: No apparent anesthesia complications

## 2011-11-02 NOTE — Anesthesia Postprocedure Evaluation (Signed)
Anesthesia Post Note  Patient: Lisa Nichols  Procedure(s) Performed: Procedure(s) (LRB): ENDARTERECTOMY FEMORAL (Left)  Anesthesia type: general  Patient location: PACU  Post pain: Pain level controlled  Post assessment: Patient's Cardiovascular Status Stable  Last Vitals:  Filed Vitals:   11/02/11 1853  BP:   Pulse: 76  Temp:   Resp: 15    Post vital signs: Reviewed and stable  Level of consciousness: sedated  Complications: No apparent anesthesia complications

## 2011-11-02 NOTE — Interval H&P Note (Signed)
History and Physical Interval Note:  11/02/2011 2:46 PM  Lisa Nichols  has presented today for surgery, with the diagnosis of Thrombis  The various methods of treatment have been discussed with the patient and family. After consideration of risks, benefits and other options for treatment, the patient has consented to  Procedure(s) (LRB): EMBOLECTOMY (N/A) as a surgical intervention .  The patients' history has been reviewed, patient examined, no change in status, stable for surgery.  I have reviewed the patients' chart and labs.  Questions were answered to the patient's satisfaction.     Leona Pressly IV, V. WELLS

## 2011-11-02 NOTE — Progress Notes (Signed)
CBC, BMET, Mag, PT/PTT drawn and sent to lab

## 2011-11-02 NOTE — Op Note (Signed)
[facility] [place]  Patient name: Lisa Nichols MRN: [id number] DOB: 23-Dec-1923 Sex: female  11/01/2011 - 11/02/2011 Pre-operative Diagnosis: Abdominal aortic aneurysm Post-operative diagnosis:  Same Surgeon:  Jorge Ny Assistants:  Della Goo Procedure:   Percutaneous endovascular repair of abdominal aortic aneurysm Anesthesia:  Local with MAC Blood Loss:  See anesthesia record Specimens:  None  Findings:  The patient had very tight iliac vessels. I tried to dilate these using a 14 French dilator on the left side the patient had a small rupture which was able to be sealed with the endograft. There was complete exclusion of the aneurysm after repair  Indications:  This is an 78 year old female who was recently hospitalized. During her workup she was found to have a large infrarenal abdominal aortic aneurysm we went back and forth trying to decide whether or not to repair her aneurysm due to its diameter size. We have elected to proceed with percutaneous repair  Procedure:  The patient was identified in the holding area and taken to [facility] OR ROOM 09  The patient was then placed supine on the table. IV sedation anesthesia was administered.  The patient was prepped and draped in the usual sterile fashion.  A time out was called and antibiotics were administered.  Ultrasound was used to evaluate bilateral femoral arteries. They were calcified but patent. Digital ultrasound images were acquired. An 11 blade was used to make a skin nick bilaterally. Bilateral common femoral arteries were accessed under ultrasound guidance with an 18-gauge needle. Benzoin wires were placed into the aorta under fluoroscopic visualization. The subcutaneous tract was dilated with a 8 [place] dilator. I elected to preclose each groin with a Proglide device deployed in the 1:00 and 11:00 position. I had some difficulty navigating wire access into the descending thoracic aorta. Ultimately  however I was able to get good wire access and 8 French sheaths placed bilaterally. The patient was then fully heparinized. A contrast injection was performed with the catheter at the level of the renal vessels. I initially planned on going primary left angulation tortuosity of her aorta. She did have a tight iliac stenosis on the left. I tried to gently daughter this with a 99 [place] dilator. Is mild resistance when the dilator was placed when I removed it the patient had an immediate drop in her blood pressure from 170 to 100. I immediately placed a 12 French sheath which And not whatever tear might have occurred. Since the third of difficult to get an 44 French sheath up this side I elected to change to a primary right sided deployment I was able to advance an 40 French sheath over a Lunderquist wire from the right groin into the aorta. I prepared the device in the back table this was a Gore Excluder 28 x 12 x 16. It was advanced over the Lunderquist wire level of the renal vessels. The distal antrum was performed confirming the location of the left renal artery which was the lowest renal artery. The graft was then deployed down to the contralateral gate. Next using a guidewire technique I cannulated the gate with a KM the catheter. The Omni flush was able to be freely rotatable within the device, confirming successful cannulation. I then advanced a 12 French sheath into the contralateral gate. I then molded proximal portion of the device at this time. I selected a 14 x 12 contralateral extension. This was advanced through the sheath and the sheath was withdrawn. It was  at this point I elected to perform a retrograde angiogram to locate the left hypogastric artery. The device was in good position at the level of the left hypogastric artery. It was then deployed. The patient did not have any hemodynamic changes at this point. I then completed the deployment of the ipsilateral leg. The device was then molded to  confirmation and a completion arteriogram was performed. There was complete exclusion of the aneurysm with preservation of both renal arteries and both hypogastric arteries. I did feel like there was residual stenosis within the left limb of the graft which is where the original stenosis was. I elected to take a 5 x 40 balloon to perform balloon angioplasty. This was taken extubated pressure was 24 atmospheres. It was held for 1 minute. A retrograde picture was then performed which showed widely patent across this area. There was still residual stenosis however I felt that she was a high risk for complication if I proceeded therefore elected to leave some residual stenosis. Did not think that the graft was at risk for occlusion. I then switched out for a Benson wires bilaterally. The sheaths were removed and the groins were closed using the previously deployed Proglide devices. The groins were then closed with 4-0 Vicryl. Dermabond placed a wound. Patient tolerated the procedure well there no complications.   Disposition:  To PACU in stable condition.   Juleen China, M.D. [facility] [place] Office: [phone] Pager:  [phone]

## 2011-11-02 NOTE — Progress Notes (Signed)
Dr. Myra Gianotti informed of patient's HBG of 4.9.  Ordered 2 units of Packed red blood cells.

## 2011-11-02 NOTE — Interval H&P Note (Signed)
History and Physical Interval Note:  11/02/2011 7:29 AM  Corine Shelter  has presented today for surgery, with the diagnosis of Abdominal aortic aneurysm  The various methods of treatment have been discussed with the patient and family. After consideration of risks, benefits and other options for treatment, the patient has consented to  Procedure(s) (LRB): ABDOMINAL AORTIC ENDOVASCULAR STENT GRAFT (N/A) as a surgical intervention .  The patients' history has been reviewed, patient examined, no change in status, stable for surgery.  I have reviewed the patients' chart and labs.  Questions were answered to the patient's satisfaction.     Enriqueta Augusta IV, V. WELLS

## 2011-11-02 NOTE — Progress Notes (Signed)
Dr. Myra Gianotti at bedside.  Pulses checked.  Absence of pulses to left leg.  [place] ordered.  Dr. Katrinka Blazing notified of events.

## 2011-11-02 NOTE — H&P (View-Only) (Signed)
The patient returned to the recovery room following percutaneous endovascular aneurysm repair. She was complaining of pain in her left leg. Unable to get a Doppler signal in her left foot. She had palpable pulses at the end of the procedure. I could not feel a pulse in her groin either. I ordered a duplex ultrasound which revealed that there was no flow within the groin. At this point unsure as to whether this was a closure device complication or whether these stenosis and angulation within the aortic bifurcation is causing the problem. Regardless the patient has an ischemic leg and easily brought back to the operating room. I discussed this with the patient as well as her son. They're very realistic about expectations. I discuss proceeding with thrombectomy and possible self-expanding stent within the kinked portion of the stent graft versus a femoral-femoral bypass graft.  The patient did have a decrease in her hemoglobin. She was transfused 2 units of blood in the recovery room. This most likely represents a delusional that in addition to blood loss from the operation with her iliac rupture. She remained hemodynamically stable to  [employer]

## 2011-11-02 NOTE — Op Note (Signed)
[facility] [place]  Patient name: Lisa Nichols MRN: [id number] DOB: 10/05/1923 Sex: female  11/01/2011 - 11/02/2011 Pre-operative Diagnosis: Ischemic left foot, s/p EVAR Post-operative diagnosis:  Same Surgeon:  Jorge Ny Assistants:  Lester Kinsman Procedure:   Left CFA exposure   Left CFA endarterectomy with patch angioplasty Anesthesia:  Local/MAC Blood Loss:  See anesthesia record Specimens:  Femoral plaque  Findings:  Occlusion of the femoral artery at the site of the closure device.  Following the procedure the patient had a palpable DP pulse  Indications:  The patient began complaining of a painful foot in the PACU following Perc EVAR.  I could not detect a doppler signal.  She had a palpable pulse after the procedure.  A vascular ultrasound showed femoral occlusion.  The patient was taken back to the OR for further exploration.  I discussed this with the patient and her son(via telephone)  Procedure:  The patient was identified in the holding area and taken to [facility] OR ROOM 09  The patient was then placed supine on the table. IV sedation anesthesia was administered.  The patient was prepped and draped in the usual sterile fashion.  A time out was called and antibiotics were administered.  1% local anesthesia was infiltrated in to the skin.  An oblique groin incision was made and the CFA, SFA, and {FA arteries were individually isolated and encircled with vessel loops.  I further dissected out the distal external iliac artery under the inguinal ligament.  The patient was heparinized.  I could feel a palpable pulse under the inguinal ligament.  This was above where the Proglide device had been deployed.  I could not feel a pulse distal to the Proglide.  The arteries were occluded.  A #11 blade was used to make an arteriotomy which I extended longitudinally to get a better evaluation of her femoral vessel since it had significant posterior plaque in this region.   It appeared that the Proglide had occluded the femoral artery at its deployment site.  Once the Proglide had been cut out, there was immediate return of pulsatile blood.  The artery was quickly re-occluded.  I passed a #3 Fogarty catheter distally down the PFA and SFA.  No thrombus was returned , and there was back bleeding.  Due to the extensive plaque in the femoral vessel, I elected to do an endarterectomy.  The entire CFA was endarterectomized.  A good distal endpoint was obtained.  I chose a bovine pericardial patch and performed patch angioplasty of the CFA.  Prior to completion, the appropriate flush maneuvers were performed and the patch repair was completed.  The patient regained a palpable DP pulse in the foot and the foot regained a more normal color.  The heparin was reversed with 50 mg of Protamine.  Once hemostasis was complete, the femoral sheath was re-approximated with 2-0 vicryl, the subq tissue was closed in 2 layers of 3-0 vicryl and the sin was closed with 4-0 vicryl.  Dermabond was placed on the skin.  The patient tolerated the procedure well.  There were no apparent complications.   Disposition:  To PACU in stable condition.   Juleen China, M.D. [facility] [place] Office: [phone] Pager:  [phone]

## 2011-11-02 NOTE — Progress Notes (Signed)
VASCULAR LAB PRELIMINARY  PRELIMINARY  PRELIMINARY  PRELIMINARY  Left lower extremity arterial duplex completed.    Preliminary report: Duplex scan reveals no evidence of arterial flow from just above the inguinal ligament to the ankle. There is flow to the right common femoral however there is a significant stenosis with plaque formation.  Adyn Serna D, RVS 11/02/2011, 1:06 PM

## 2011-11-02 NOTE — Transfer of Care (Signed)
Immediate Anesthesia Transfer of Care Note  Patient: Lisa Nichols  Procedure(s) Performed: Procedure(s) (LRB): ABDOMINAL AORTIC ENDOVASCULAR STENT GRAFT (N/A)  Patient Location: PACU  Anesthesia Type: MAC  Level of Consciousness: awake, alert  and patient cooperative  Airway & Oxygen Therapy: Patient Spontanous Breathing and Patient connected to nasal cannula oxygen  Post-op Assessment: Report given to PACU RN, Post -op Vital signs reviewed and stable and Patient moving all extremities  Post vital signs: Reviewed and stable  Complications: No apparent anesthesia complications and legs hurt

## 2011-11-02 NOTE — Progress Notes (Signed)
The patient returned to the recovery room following percutaneous endovascular aneurysm repair. She was complaining of pain in her left leg. Unable to get a Doppler signal in her left foot. She had palpable pulses at the end of the procedure. I could not feel a pulse in her groin either. I ordered a duplex ultrasound which revealed that there was no flow within the groin. At this point unsure as to whether this was a closure device complication or whether these stenosis and angulation within the aortic bifurcation is causing the problem. Regardless the patient has an ischemic leg and easily brought back to the operating room. I discussed this with the patient as well as her son. They're very realistic about expectations. I discuss proceeding with thrombectomy and possible self-expanding stent within the kinked portion of the stent graft versus a femoral-femoral bypass graft.  The patient did have a decrease in her hemoglobin. She was transfused 2 units of blood in the recovery room. This most likely represents a delusional that in addition to blood loss from the operation with her iliac rupture. She remained hemodynamically stable to  Lisa Nichols 

## 2011-11-02 NOTE — Preoperative (Signed)
Beta Blockers   Reason not to administer Beta Blockers: due at 10 a.m.

## 2011-11-02 NOTE — Anesthesia Postprocedure Evaluation (Signed)
  Anesthesia Post-op Note  Patient: Lisa Nichols  Procedure(s) Performed: Procedure(s) (LRB): ABDOMINAL AORTIC ENDOVASCULAR STENT GRAFT (N/A)  Patient Location: PACU  Anesthesia Type: MAC  Level of Consciousness: awake, oriented, sedated and patient cooperative  Airway and Oxygen Therapy: Patient Spontanous Breathing and Patient connected to nasal cannula oxygen  Post-op Pain: none  Post-op Assessment: Post-op Vital signs reviewed, Patient's Cardiovascular Status Stable, Respiratory Function Stable, Patent Airway, No signs of Nausea or vomiting and Pain level controlled  Post-op Vital Signs: stable  Complications: No apparent anesthesia complications

## 2011-11-03 ENCOUNTER — Encounter (HOSPITAL_COMMUNITY): Payer: Self-pay | Admitting: Anesthesiology

## 2011-11-03 ENCOUNTER — Inpatient Hospital Stay (HOSPITAL_COMMUNITY): Payer: Medicare Other | Admitting: Anesthesiology

## 2011-11-03 ENCOUNTER — Encounter (HOSPITAL_COMMUNITY)
Admission: RE | Disposition: A | Discharge status: Skilled Nursing Facility | Payer: Self-pay | Source: Ambulatory Visit | Attending: Surgery

## 2011-11-03 HISTORY — PX: FASCIOTOMY: SHX132

## 2011-11-03 LAB — CBC
HCT: 28.5 % — ABNORMAL LOW (ref 36.0–46.0)
Hemoglobin: 9.3 g/dL — ABNORMAL LOW (ref 12.0–15.0)
MCH: 26.9 pg (ref 26.0–34.0)
MCHC: 32.6 g/dL (ref 30.0–36.0)
RBC: 3.46 MIL/uL — ABNORMAL LOW (ref 3.87–5.11)

## 2011-11-03 LAB — BASIC METABOLIC PANEL
BUN: 24 mg/dL — ABNORMAL HIGH (ref 6–23)
CO2: 19 mEq/L (ref 19–32)
Calcium: 7 mg/dL — ABNORMAL LOW (ref 8.4–10.5)
GFR calc non Af Amer: 58 mL/min — ABNORMAL LOW (ref >90)
Glucose, Bld: 159 mg/dL — ABNORMAL HIGH (ref 70–99)

## 2011-11-03 SURGERY — FASCIOTOMY, UPPER EXTREMITY
Anesthesia: General | Site: Leg Lower | Laterality: Left | Wound class: Clean

## 2011-11-03 MED ORDER — PHENYLEPHRINE HCL 10 MG/ML IJ SOLN
10.0000 mg | INTRAVENOUS | Status: DC | PRN
  Administered 2011-11-03: 10 ug/min via INTRAVENOUS

## 2011-11-03 MED ORDER — MORPHINE SULFATE 2 MG/ML IJ SOLN: 0.0500 mg/kg | INTRAMUSCULAR | Status: DC | PRN

## 2011-11-03 MED ORDER — FENTANYL CITRATE 0.05 MG/ML IJ SOLN
INTRAMUSCULAR | Status: DC | PRN
  Administered 2011-11-03: 100 ug via INTRAVENOUS
  Administered 2011-11-03: 50 ug via INTRAVENOUS
  Administered 2011-11-03 (×2): 25 ug via INTRAVENOUS

## 2011-11-03 MED ORDER — LACTATED RINGERS IV SOLN
INTRAVENOUS | Status: DC | PRN
  Administered 2011-11-03: 11:00:00 via INTRAVENOUS

## 2011-11-03 MED ORDER — ONDANSETRON HCL 4 MG/2ML IJ SOLN: 4.0000 mg | Freq: Once | INTRAMUSCULAR | Status: DC | PRN

## 2011-11-03 MED ORDER — HYDROMORPHONE HCL PF 1 MG/ML IJ SOLN: 0.2500 mg | INTRAMUSCULAR | Status: DC | PRN

## 2011-11-03 MED ORDER — SUCCINYLCHOLINE CHLORIDE 20 MG/ML IJ SOLN
INTRAMUSCULAR | Status: DC | PRN
  Administered 2011-11-03: 100 mg via INTRAVENOUS

## 2011-11-03 MED ORDER — ONDANSETRON HCL 4 MG/2ML IJ SOLN
INTRAMUSCULAR | Status: DC | PRN
  Administered 2011-11-03 (×2): 4 mg via INTRAVENOUS

## 2011-11-03 MED ORDER — PROPOFOL 10 MG/ML IV EMUL
INTRAVENOUS | Status: DC | PRN
  Administered 2011-11-03: 100 mg via INTRAVENOUS

## 2011-11-03 MED ORDER — 0.9 % SODIUM CHLORIDE (POUR BTL) OPTIME
TOPICAL | Status: DC | PRN
  Administered 2011-11-03: 1000 mL

## 2011-11-03 MED ORDER — PHENYLEPHRINE HCL 10 MG/ML IJ SOLN
INTRAMUSCULAR | Status: DC | PRN
  Administered 2011-11-03 (×3): 40 ug via INTRAVENOUS

## 2011-11-03 MED ORDER — MEPERIDINE HCL 25 MG/ML IJ SOLN: 6.2500 mg | INTRAMUSCULAR | Status: DC | PRN

## 2011-11-03 SURGICAL SUPPLY — 18 items
BANDAGE ACE 4 STERILE (GAUZE/BANDAGES/DRESSINGS) ×2 IMPLANT
BANDAGE ELASTIC 6 VELCRO ST LF (GAUZE/BANDAGES/DRESSINGS) ×2 IMPLANT
BANDAGE GAUZE ELAST BULKY 4 IN (GAUZE/BANDAGES/DRESSINGS) ×2 IMPLANT
CLIP TI MEDIUM 24 (CLIP) ×2 IMPLANT
CLIP TI WIDE RED SMALL 24 (CLIP) ×4 IMPLANT
CLIP TI WIDE RED SMALL 6 (CLIP) ×2 IMPLANT
DRSG PAD ABDOMINAL 8X10 ST (GAUZE/BANDAGES/DRESSINGS) ×4 IMPLANT
GLOVE BIO SURGEON STRL SZ 6.5 (GLOVE) ×2 IMPLANT
GLOVE BIO SURGEON STRL SZ7.5 (GLOVE) ×2 IMPLANT
GLOVE BIOGEL PI IND STRL 7.5 (GLOVE) ×1 IMPLANT
GLOVE BIOGEL PI INDICATOR 7.5 (GLOVE) ×1
GOWN BRE IMP SLV AUR LG STRL (GOWN DISPOSABLE) ×2 IMPLANT
KIT BASIN OR (CUSTOM PROCEDURE TRAY) ×2 IMPLANT
PACK PERIPHERAL VASCULAR (CUSTOM PROCEDURE TRAY) ×2 IMPLANT
SPONGE GAUZE 4X4 FOR O.R. (GAUZE/BANDAGES/DRESSINGS) ×4 IMPLANT
SUT ETHILON 3 0 PS 1 (SUTURE) ×42 IMPLANT
TOWEL OR 17X24 6PK STRL BLUE (TOWEL DISPOSABLE) ×2 IMPLANT
TOWEL OR 17X26 10 PK STRL BLUE (TOWEL DISPOSABLE) ×2 IMPLANT

## 2011-11-03 NOTE — Progress Notes (Addendum)
[facility] Progress Note  11/03/2011 7:01 AM POD 1  Subjective:  Complains that her leg is numb. Foley with bloody urine   Filed Vitals:   11/03/11 0400  BP:   Pulse:   Temp: 97.4 F (36.3 C)  Resp:     Physical Exam: Incisions:  Left groin incision with ecchymosis, but soft and no hematoma.  Right groin is soft. Extremities:  +palpable DP pulse bilaterally. She does have some motor function in tact with wiggling her toes.  She appears to have a little foot drop on the left.  She has no sensation on the left foot.  She is unable to dorsiflex on the left.  She has plantarflexion on the left, but is weaker than the right.  Left calf is soft and non-tender with palpation. Nurse states that 3rd, 4th, and 5th toes color have improved since the pt was brought to the unit from the PACU.  CBC    Component Value Date/Time   WBC 10.4 11/02/2011 2015   RBC 3.41* 11/02/2011 2015   HGB 9.4* 11/02/2011 2015   HCT 27.9* 11/02/2011 2015   PLT 125* 11/02/2011 2015   MCV 81.8 11/02/2011 2015   MCH 27.6 11/02/2011 2015   MCHC 33.7 11/02/2011 2015   RDW 16.1* 11/02/2011 2015   LYMPHSABS 2.0 10/25/2011 1501   MONOABS 0.9 10/25/2011 1501   EOSABS 0.3 10/25/2011 1501   BASOSABS 0.0 10/25/2011 1501    BMET    Component Value Date/Time   NA 133* 11/03/2011 0603   K 3.8 11/03/2011 0603   CL 108 11/03/2011 0603   CO2 19 11/03/2011 0603   GLUCOSE 159* 11/03/2011 0603   BUN 24* 11/03/2011 0603   CREATININE 0.88 11/03/2011 0603   CALCIUM 7.0* 11/03/2011 0603   GFRNONAA 58* 11/03/2011 0603   GFRAA 68* 11/03/2011 0603    INR    Component Value Date/Time   INR 1.63* 11/02/2011 1330     Intake/Output Summary (Last 24 hours) at 11/03/11 0701 Last data filed at 11/03/11 0600  Gross per 24 hour  Intake   4900 ml  Output   1385 ml  Net   3515 ml     Assessment/Plan:  78 y.o. female is s/p EVAR and take back for occlusion of the femoral artery at the sight of the closure device. POD  1 -The pt's sensation and some motor function are not in tact.  She was ischemic for a while yesterday before being taken back to the OR. -will get PT/OT consult and treat.  Will have PT order a DARCO shoe. -darco shoe for the left. -BUN/Cr stable, but pt with bloody drainage from foley.  Will leave foley another day given bloody drainage and need to monitor UOP. -acute surgical blood loss anemia and required PRBCs.  Her CBC is not back yet this am. -? Leave in stepdown today.  Will d/w Dr. Edilia Bo.   Newton Pigg, PA-C [facility] [phone] 11/03/2011 7:01 AM  Addendum:  This patient was noted this morning to have a foot drop on the left side. Once I was made aware I evaluated the patient immediately. The patient is noted to have a foot drop on the left with moderate swelling in the left calf. I think that she has developed an early compartment syndrome on the left and I have recommended emergent 4 compartment fasciotomy. I have discussed the procedure and risks with the patient and also try to contact her son. I  did leave a message on his answering machine. The patient does have excellent Doppler flow in both feet and both feet appear adequately perfused. Will proceed urgently with 4 compartment fasciotomy of left leg.  Di Kindle. Edilia Bo, MD, FACS [facility] [phone] 11/03/2011

## 2011-11-03 NOTE — Anesthesia Preprocedure Evaluation (Addendum)
Anesthesia Evaluation  Patient identified by MRN, date of birth, ID band Patient awake    Reviewed: Allergy & Precautions, H&P , NPO status , Patient's Chart, lab work & pertinent test results, reviewed documented beta blocker date and time   Airway       Dental  (+) Edentulous Upper and Edentulous Lower   Pulmonary shortness of breath, COPDformer smoker         Cardiovascular hypertension, Pt. on home beta blockers + CAD and +CHF     Neuro/Psych    GI/Hepatic hiatal hernia,   Endo/Other  Diabetes mellitus-  Renal/GU      Musculoskeletal   Abdominal   Peds  Hematology   Anesthesia Other Findings   Reproductive/Obstetrics                           Anesthesia Physical Anesthesia Plan  ASA: III and Emergent  Anesthesia Plan: General   Post-op Pain Management:    Induction: Intravenous, Rapid sequence and Cricoid pressure planned  Airway Management Planned: Oral ETT  Additional Equipment:   Intra-op Plan:   Post-operative Plan: Extubation in OR  Informed Consent: I have reviewed the patients History and Physical, chart, labs and discussed the procedure including the risks, benefits and alternatives for the proposed anesthesia with the patient or authorized representative who has indicated his/her understanding and acceptance.     Plan Discussed with: CRNA and Surgeon  Anesthesia Plan Comments:         Anesthesia Quick Evaluation

## 2011-11-03 NOTE — Op Note (Signed)
NAME: Lisa Nichols   MRN: [id number] DOB: 02/28/1924    DATE OF OPERATION: 11/03/2011  PREOP DIAGNOSIS: compartment syndrome left leg  POSTOP DIAGNOSIS: same  PROCEDURE: 4 compartment fasciotomy left leg  SURGEON: Di Kindle. Edilia Bo, MD, FACS  ASSIST: none  ANESTHESIA: Gen.   EBL: minimal  INDICATIONS: Jhada Risk is a 78 y.o. female who underwent EVAR. Postoperatively she had ischemia of her left leg and was taken back for repair of the left common femoral artery. This morning it was noted that she had a foot drop on the left and had moderate swelling of her left calf. She was taken urgently for fasciotomy.  FINDINGS: there was moderately increased compartment pressure in the anterior compartment, lateral compartment, and superficial posterior compartment.  TECHNIQUE: The patient was taken to the operating room and received a general anesthetic. The left lower extremity was prepped and draped in the usual sterile fashion. A longitudinal incision was made along the medial aspect of the left calf. The dissection was carried down to the subcutaneous tissue to the fascia of the superficial posterior compartment. The fascia was opened and there was moderately increased compartment pressure here. And extended the fascial incision proximally. Distally I was able to extend the fascial incision well beyond the length of the skin incision, thus the compressing the superficial posterior compartment. Next the soleus muscle was divided using electrocautery and the deep posterior compartment did not appear to be under elevated pressure. Attention was then turned to the anterior and lateral compartments. A longitudinal incision was made over the lateral compartment of the left leg. The dissection was carried down to the fascia which was opened again well beyond the length of the skin incision. The lateral compartment was under moderately increased pressure and was fully decompressed. I then developed a  plane of dissection anteriorly allowing exposure of the fascia to the anterior compartment. This compartment was opened and was also under moderately increased pressure. Again the fascial incision was made extending well beyond the level of the skin incision. Hemostasis was obtained in the wounds. I elected to close both of the incisions with interrupted vertical mattress sutures using 3-0 nylon which was not tied. The plan being to try this at the bedside as the swelling decreased. A sterile dressing was placed. Moist 4 x 4's were placed over the open areas and then covered with ABDs pads Kerlix and 4 inch and 6 inch Ace. Patient tolerated the procedure well and was transferred to recovery in stable condition. All needle and sponge counts were correct.  Waverly Ferrari, MD, FACS [facility]  DATE OF DICTATION:   11/03/2011

## 2011-11-03 NOTE — Anesthesia Procedure Notes (Signed)
Procedure Name: Intubation Date/Time: 11/03/2011 11:02 AM Performed by: Elon Alas Pre-anesthesia Checklist: Timeout performed, Patient identified, Emergency Drugs available, Suction available and Patient being monitored Patient Re-evaluated:Patient Re-evaluated prior to inductionOxygen Delivery Method: Circle system utilized Preoxygenation: Pre-oxygenation with 100% oxygen Intubation Type: IV induction and Cricoid Pressure applied Ventilation: Mask ventilation without difficulty Laryngoscope Size: Mac and 3 Grade View: Grade I Tube type: Oral Tube size: 7.5 mm Number of attempts: 1 Airway Equipment and Method: Stylet Placement Confirmation: ETT inserted through vocal cords under direct vision and breath sounds checked- equal and bilateral Secured at: 20 cm Tube secured with: Tape Dental Injury: Teeth and Oropharynx as per pre-operative assessment

## 2011-11-03 NOTE — Progress Notes (Signed)
PT Cancellation Note  Treatment cancelled today due to patient receiving procedure or test. Pt had foot drop this morning and was taken for a fasciotomy. Will attempt evaluation tomorrow pending availability.  11/03/2011 Milana Kidney DPT PAGER: [phone] OFFICE: [phone]

## 2011-11-03 NOTE — Anesthesia Postprocedure Evaluation (Signed)
Anesthesia Post Note  Patient: Lisa Nichols  Procedure(s) Performed: Procedure(s) (LRB): FASCIOTOMY (Left)  Anesthesia type: general  Patient location: PACU  Post pain: Pain level controlled  Post assessment: Patient's Cardiovascular Status Stable  Last Vitals:  Filed Vitals:   11/03/11 1300  BP: 151/50  Pulse: 77  Temp: 37.4 C  Resp: 14    Post vital signs: Reviewed and stable  Level of consciousness: sedated  Complications: No apparent anesthesia complications

## 2011-11-03 NOTE — Preoperative (Signed)
Beta Blockers   Reason not to administer Beta Blockers:Not Applicable 

## 2011-11-03 NOTE — Transfer of Care (Signed)
Immediate Anesthesia Transfer of Care Note  Patient: Lisa Nichols  Procedure(s) Performed: Procedure(s) (LRB): FASCIOTOMY (Left)  Patient Location: PACU  Anesthesia Type: General  Level of Consciousness: awake, alert  and oriented  Airway & Oxygen Therapy: Patient Spontanous Breathing and Patient connected to nasal cannula oxygen  Post-op Assessment: Report given to PACU RN, Post -op Vital signs reviewed and stable and Patient moving all extremities X 4  Post vital signs: Reviewed and stable  Complications: No apparent anesthesia complications

## 2011-11-04 LAB — BASIC METABOLIC PANEL
Chloride: 107 mEq/L (ref 96–112)
Creatinine, Ser: 1.11 mg/dL — ABNORMAL HIGH (ref 0.50–1.10)
GFR calc Af Amer: 51 mL/min — ABNORMAL LOW (ref >90)
Potassium: 3.7 mEq/L (ref 3.5–5.1)

## 2011-11-04 LAB — CBC
Platelets: 102 10*3/uL — ABNORMAL LOW (ref 150–400)
RDW: 16.5 % — ABNORMAL HIGH (ref 11.5–15.5)
WBC: 9.6 10*3/uL (ref 4.0–10.5)

## 2011-11-04 NOTE — Evaluation (Signed)
Physical Therapy Evaluation Patient Details Name: Lisa Nichols MRN: [id number] DOB: 01/08/1924 Today's Date: 11/04/2011  Problem List:  Patient Active Problem List  Diagnoses  . Abdominal aneurysm without mention of rupture    Past Medical History:  Past Medical History  Diagnosis Date  . CAD (coronary artery disease)   . Anemia   . AAA (abdominal aortic aneurysm)   . History of syncope   . Compression fracture of thoracic vertebra 2008    T7- T8  . Cholelithiases   . Carotid artery occlusion   . Diverticulosis of colon   . Hiatal hernia   . CHF (congestive heart failure)   . Kyphosis   . Blood transfusion   . Urinary tract infection     hx of  . Diabetes mellitus   . Bronchitis     hx of  . Hypertension     sees Dr. Iran Ouch, primary doctor   . COPD (chronic obstructive pulmonary disease)     on O2 at 2 L [place] continuous sees Dr. Sherril Croon  . Shortness of breath    Past Surgical History:  Past Surgical History  Procedure Date  . Spine surgery     kyphoplasty  . Cardiac catheterization 2009  . Abdominal hysterectomy   . Bladder suspension   . Dilation and curettage of uterus     PT Assessment/Plan/Recommendation PT Assessment Clinical Impression Statement: 78 year old from SNF admitted for Lt. LE fasciotomy. Pt presents to PT with decreased mobility and decreased ability to functionally utilize Lt. LE at this time. Pt continues with Lt. foot drop in addition to decreased control of inversion in standing with noted Lt. knee hyperextension initially in standing. Pt may benefit from an AFO for improved Lt. LE control in the future pending progress. Will benefit from improved endurance and mobility to promote higher level of function. Pt desires to return to her previous SNF.  PT Recommendation/Assessment: Patient will need skilled PT in the acute care venue PT Problem List: Decreased strength;Decreased range of motion;Decreased activity tolerance;Decreased balance;Decreased  mobility;Decreased knowledge of use of DME;Impaired sensation;Cardiopulmonary status limiting activity;Decreased skin integrity Barriers to Discharge: None PT Therapy Diagnosis : Difficulty walking;Abnormality of gait;Generalized weakness PT Plan PT Frequency: Min 3X/week PT Treatment/Interventions: DME instruction;Gait training;Functional mobility training;Therapeutic activities;Therapeutic exercise;Balance training;Patient/family education PT Recommendation Follow Up Recommendations: Skilled nursing facility;Supervision/Assistance - 24 hour Equipment Recommended: Defer to next venue (may need AFO in future) PT Goals  Acute Rehab PT Goals PT Goal Formulation: With patient Time For Goal Achievement: 2 weeks Pt will Roll Supine to Right Side: with modified independence PT Goal: Rolling Supine to Right Side - Progress: Goal set today Pt will Roll Supine to Left Side: with modified independence PT Goal: Rolling Supine to Left Side - Progress: Goal set today Pt will go Supine/Side to Sit: with supervision PT Goal: Supine/Side to Sit - Progress: Goal set today Pt will go Sit to Supine/Side: with min assist PT Goal: Sit to Supine/Side - Progress: Goal set today Pt will go Sit to Stand: with supervision PT Goal: Sit to Stand - Progress: Goal set today Pt will go Stand to Sit: with supervision PT Goal: Stand to Sit - Progress: Goal set today Pt will Transfer Bed to Chair/Chair to Bed: with min assist PT Transfer Goal: Bed to Chair/Chair to Bed - Progress: Goal set today Pt will Ambulate: 16 - 50 feet;with min assist;with rolling walker PT Goal: Ambulate - Progress: Goal set today  PT Evaluation Precautions/Restrictions  Prior Functioning  Home Living Lives With: Alone (at nursing home) Type of Home: Skilled Nursing Facility Prior Function Level of Independence: Requires assistive device for independence;Needs assistance with ADLs Bath: Maximal Driving: No Vocation:  Retired Comments: Was living at a rest home, then doctor sent her to rehab center then she got sick. Would like to go back to rehab center.  Cognition Cognition Arousal/Alertness: Awake/alert Overall Cognitive Status: Appears within functional limits for tasks assessed Orientation Level: Oriented X4 Cognition - Other Comments: Slightly verbose Sensation/Coordination Sensation Light Touch: Impaired Detail Light Touch Impaired Details: Impaired LLE;Impaired RLE (Lt. more impaired than Rt. ) Coordination Gross Motor Movements are Fluid and Coordinated: Yes Extremity Assessment RLE Assessment RLE Assessment: Exceptions to [facility] RLE AROM (degrees) Overall AROM Right Lower Extremity: Within functional limits for tasks assessed RLE Strength RLE Overall Strength: Deficits RLE Overall Strength Comments: Generalized deconditioning, grossly >/= 4-/5 LLE Assessment LLE Assessment: Exceptions to WFL LLE AROM (degrees) Left Knee Flexion 0-140: 90  ("stiffness") Left Ankle Dorsiflexion 0-20: -10  (degrees) Left Ankle Eversion: 10  (degrees) LLE Strength LLE Overall Strength: Deficits Left Knee Flexion: 3-/5 Left Knee Extension: 3/5 Left Ankle Dorsiflexion: 2+/5 Left Ankle Plantar Flexion: 3-/5 Mobility (including Balance) Bed Mobility Bed Mobility: Yes Rolling Left: 4: Min assist Rolling Left Details (indicate cue type and reason): Verbal cues for sequencing and efficiency. PT to support Lt. LE and avoid contact with bed or Rt. LE.  Left Sidelying to Sit: 3: Mod assist Left Sidelying to Sit Details (indicate cue type and reason): Assist for Bil. LEs and trunk. Movement appeared very difficult for patient. Verbal cues for efficiency.  Sitting - Scoot to Edge of Bed: 3: Mod assist Sitting - Scoot to Edge of Bed Details (indicate cue type and reason): Assist through pad to advance pelvis anteriorly.  Transfers Transfers: Yes Sit to Stand: 2: Max assist Sit to Stand Details (indicate cue type  and reason): Verbal cues for anterior translation, UE positioning. Pt with decreased stability of Lt. ankle which attempted to roll and pt with Lt. knee recurvatuum, PT to assist to stabilize knee and ankle. Pt requires RW to obtain full standing.  Stand to Sit: 1: +1 Total assist;To chair/3-in-1 Stand to Sit Details: Pt = 60%. PT to safely lower pt to chair, pt unable to reach for chair with UEs although cued. Pt with decreased ability to fully square with chair prior to sitting.  Stand Pivot Transfers: 1: +1 Total assist Stand Pivot Transfer Details (indicate cue type and reason): pt = 50%. Verbal cues for LE sequencing. PT to assist with Lt. LE to prevent buckling or recurvatuum of knee and to decreased Lt. ankle rolling. PT also to negotiate RW, pt tends to allow RW to get too anterior.  Ambulation/Gait Ambulation/Gait: No (pt unable today)    Exercise  General Exercises - Lower Extremity Ankle Circles/Pumps: AROM;AAROM;Left;Right;15 reps;Supine (heels unweighted) Quad Sets: AROM;Both;10 reps;Supine Long Arc Quad: AROM;Both;5 reps;Seated End of Session PT - End of Session Equipment Utilized During Treatment: Gait belt Activity Tolerance: Patient limited by fatigue;Treatment limited secondary to medical complications (Comment) (SpO2 desaturation - ?true reading) Patient left: in chair;with call bell in reach Nurse Communication: Mobility status for transfers General Behavior During Session: [facility] for tasks performed Cognition: Impaired Cognitive Impairment: Decreased processing, expected for age  Sherie Don 11/04/2011, 3:34 PM  Dahlia Client Beverely Pace) Carleene Mains PT, DPT Acute Rehabilitation [phone]

## 2011-11-04 NOTE — Plan of Care (Signed)
Problem: Consults Goal: Diagnosis CEA/CES/AAA Stent Abdominal Aortic Aneurysm Stent (AAA)     

## 2011-11-04 NOTE — Progress Notes (Signed)
VASCULAR PROGRESS NOTE  SUBJECTIVE: Left leg feels a little bit stronger. No specific complaints.  PHYSICAL EXAM: Filed Vitals:   11/03/11 1800 11/03/11 1921 11/03/11 2317 11/04/11 0300  BP: 137/49 134/48 120/39 115/43  Pulse: 80 78 71 71  Temp:  98.6 F (37 C) 99.1 F (37.3 C) 98.5 F (36.9 C)  TempSrc:  Oral Oral Oral  Resp: 24 22 11 17   Height:      Weight:      SpO2: 99% 96% 99% 98%   Lungs: clear to auscultation Groin incisions both looked fine. Palpable dorsalis pedis pulses bilaterally.  LABS: Lab Results  Component Value Date   WBC 9.6 11/04/2011   HGB 8.2* 11/04/2011   HCT 25.1* 11/04/2011   MCV 83.7 11/04/2011   PLT 102* 11/04/2011   Lab Results  Component Value Date   CREATININE 1.11* 11/04/2011   Lab Results  Component Value Date   INR 1.63* 11/02/2011   CBG (last 3)   Basename 11/02/11 2128 11/02/11 1805 11/02/11 1453  GLUCAP 130* 113* 184*     ASSESSMENT/PLAN: 1. 1 Day Post-Op s/p: 4 compartment fasciotomy left leg. Postop day 2 status post EVAR and repair of left common femoral artery. 2. Will return later today when dressing change on fasciotomy site left leg. 3. Urine output low. Renal function is normal. Continue gentle hydration with follow up BMET tomorrow. 4. Acute blood loss anemia: Improved after transfusion on day of surgery the hemoglobin history take down again. Repeat CBC in a.m.  Waverly Ferrari, MD, FACS Beeper: [phone] 11/04/2011  Addendum: Wound inspected by Newton Pigg, PA. Looked clean and well perfused.   Di Kindle. Edilia Bo, MD, FACS Beeper [phone] 11/04/2011

## 2011-11-05 ENCOUNTER — Other Ambulatory Visit: Payer: Self-pay | Admitting: *Deleted

## 2011-11-05 ENCOUNTER — Encounter (HOSPITAL_COMMUNITY): Payer: Self-pay | Admitting: Vascular Surgery

## 2011-11-05 DIAGNOSIS — Z8679 Personal history of other diseases of the circulatory system: Secondary | ICD-10-CM

## 2011-11-05 DIAGNOSIS — I714 Abdominal aortic aneurysm, without rupture: Secondary | ICD-10-CM

## 2011-11-05 LAB — CBC
HCT: 22.8 % — ABNORMAL LOW (ref 36.0–46.0)
Hemoglobin: 7.3 g/dL — ABNORMAL LOW (ref 12.0–15.0)
RDW: 16.6 % — ABNORMAL HIGH (ref 11.5–15.5)
WBC: 8.2 10*3/uL (ref 4.0–10.5)

## 2011-11-05 LAB — BASIC METABOLIC PANEL
BUN: 23 mg/dL (ref 6–23)
Chloride: 109 mEq/L (ref 96–112)
GFR calc Af Amer: 59 mL/min — ABNORMAL LOW (ref >90)
Potassium: 3.4 mEq/L — ABNORMAL LOW (ref 3.5–5.1)
Sodium: 135 mEq/L (ref 135–145)

## 2011-11-05 MED ORDER — FUROSEMIDE 10 MG/ML IJ SOLN
20.0000 mg | Freq: Once | INTRAMUSCULAR | Status: AC
  Administered 2011-11-05: 20 mg via INTRAVENOUS

## 2011-11-05 MED ORDER — FUROSEMIDE 10 MG/ML IJ SOLN
INTRAMUSCULAR | Status: AC
  Administered 2011-11-05: 10 mg
  Filled 2011-11-05: qty 4

## 2011-11-05 NOTE — Anesthesia Preprocedure Evaluation (Addendum)
Anesthesia Evaluation Anesthesia Physical Anesthesia Plan  ASA: IV  Anesthesia Plan: MAC   Post-op Pain Management:    Induction: Intravenous  Airway Management Planned: Simple Face Mask  Additional Equipment:   Intra-op Plan:   Post-operative Plan:   Informed Consent:   Plan Discussed with: CRNA, Anesthesiologist and Surgeon  Anesthesia Plan Comments:         Anesthesia Quick Evaluation

## 2011-11-05 NOTE — Progress Notes (Signed)
While eating dinner, pt began to choke and had gurgling sounds in throat.  Pt unable to cough food back up, yankeur suction used to clear airway.  Pt then began to cough up all of food eaten for supper.  O2 sats down to 88% on 2L. Increased back up to 93% after pt coughed and cleared airway.  Dr. Arbie Cookey notified.  Orders received.  Will continue to monitor.  Roselie Awkward, RN

## 2011-11-05 NOTE — Progress Notes (Signed)
OT spoke with patient.  Pt  plans to DC back to [facility]- where she was living.  Will defer OT eval to SNF.   Thanks, Lise Auer, OT

## 2011-11-05 NOTE — Progress Notes (Addendum)
[facility] [place]  Post-op EVAR Date of Surgery: 11/01/2011 - 11/03/2011 Surgeon: Moishe Spice): [name], MD POD: 2 Days Post-Op POD 3 EVAR  History of Present Illness  Lisa Nichols is a 78 y.o. female who is s/p EVAR. The patient denies back pain; denies abdominal pain; complains of lower extremity pain.  He is Ambulating and taking PO well without nausea or vomiting. Pt. has voided with foley increasing output.  Purple discoloration to 4th and 5th digits warm skin to touch. IMAGING: No results found.  Significant Diagnostic Studies: CBC Lab Results  Component Value Date   WBC 8.2 11/05/2011   HGB 7.3* 11/05/2011   HCT 22.8* 11/05/2011   MCV 85.1 11/05/2011   PLT 92* 11/05/2011     BMET    Component Value Date/Time   NA 135 11/05/2011 0425   K 3.4* 11/05/2011 0425   CL 109 11/05/2011 0425   CO2 22 11/05/2011 0425   GLUCOSE 108* 11/05/2011 0425   BUN 23 11/05/2011 0425   CREATININE 0.98 11/05/2011 0425   CALCIUM 7.6* 11/05/2011 0425   GFRNONAA 51* 11/05/2011 0425   GFRAA 59* 11/05/2011 0425    COAG Lab Results  Component Value Date   INR 1.63* 11/02/2011   INR 1.00 10/25/2011   INR 1.12 12/19/2010   No results found for this basename: PTT     I/O last 3 completed shifts: In: 3195 [P.O.:720; I.V.:2475] Out: 1340 [Urine:1340] No data found.   Physical Examination  BP Readings from Last 3 Encounters:  11/05/11 141/51  11/05/11 141/51  11/05/11 141/51   Temp Readings from Last 3 Encounters:  11/05/11 98.4 F (36.9 C) Oral  11/05/11 98.4 F (36.9 C) Oral  11/05/11 98.4 F (36.9 C) Oral   SpO2 Readings from Last 3 Encounters:  11/04/11 99%  11/04/11 99%  11/04/11 99%   Pulse Readings from Last 3 Encounters:  11/05/11 66  11/05/11 66  11/05/11 66    General: A&O x 3, WDWN female in NAD Gait: Normal Pulmonary: normal non-labored breathing  Cardiac: RRR Abdomen: soft, NT, NABS Bilateral groin wounds: clean, dry, intact,  without hematoma Vascular Exam/Pulses left PT palp, skin warm and soft left LE.   Left dressing in place   Extremities without ischemic changes, no Gangrene , no cellulitis; no open wounds;   Neurologic: A&O X 3; Appropriate Affect  Assessment: Lisa Nichols is a 78 y.o. female who is 2 Days Post-Op EVAR.  Pt is doing well with no complaints  Plan: Home  The importance of surveillance of the endograft was discussed with the patient  A CTA of abdomen and pelvis will be scheduled for one month to assess for endoleak.  The patient will follow up with [place] in one month with these studies. I will change dressing later today.  SignedMosetta Pigeon [phone] 11/05/2011 8:01 AM.   Dressing was changed by nursing staff and reported beefy red tissue without active bleeding.  Clean dry skin edges.   Agree with aboce  Acute blood loss anemia_  HCT decreased, will recheck Hct in am and transfuse as needed Still with left foot drop and decreased sensation.  Will need foot drop splint and physical therapy Hopefully to close fasciotomy sites tomorrow or Wednesday  Durene Cal

## 2011-11-05 NOTE — Progress Notes (Signed)
Orthopedic Tech Progress Note Patient Details:  Lisa Nichols 05-01-1924 [id number]  Patient ID: Corine Shelter, female   DOB: [birth year], 78 y.o.   MRN: [id number] [facility]  Nikki Dom 11/05/2011, 4:25 PM

## 2011-11-05 NOTE — Anesthesia Postprocedure Evaluation (Signed)
  Anesthesia Post-op Note  Patient: Lisa Nichols  Procedure(s) Performed: Procedure(s) (LRB): ENDARTERECTOMY FEMORAL (Left)  Patient Location: PACU  Anesthesia Type: MAC  Level of Consciousness: awake  Airway and Oxygen Therapy: Patient Spontanous Breathing and Patient connected to nasal cannula oxygen  Post-op Pain: mild  Post-op Assessment: Post-op Vital signs reviewed, Patient's Cardiovascular Status Stable, Respiratory Function Stable, Patent Airway, No signs of Nausea or vomiting and Pain level controlled  Post-op Vital Signs: Reviewed and stable  Complications: No apparent anesthesia complications

## 2011-11-06 ENCOUNTER — Inpatient Hospital Stay (HOSPITAL_COMMUNITY): Payer: Medicare Other

## 2011-11-06 LAB — TYPE AND SCREEN
Antibody Screen: NEGATIVE
Unit division: 0
Unit division: 0
Unit division: 0

## 2011-11-06 LAB — CBC
Hemoglobin: 12 g/dL (ref 12.0–15.0)
MCH: 28.4 pg (ref 26.0–34.0)
RBC: 4.23 MIL/uL (ref 3.87–5.11)
WBC: 9.6 10*3/uL (ref 4.0–10.5)

## 2011-11-06 LAB — BASIC METABOLIC PANEL
CO2: 23 mEq/L (ref 19–32)
Chloride: 104 mEq/L (ref 96–112)
Glucose, Bld: 156 mg/dL — ABNORMAL HIGH (ref 70–99)
Potassium: 3.1 mEq/L — ABNORMAL LOW (ref 3.5–5.1)
Sodium: 134 mEq/L — ABNORMAL LOW (ref 135–145)

## 2011-11-06 MED ORDER — FAMOTIDINE 20 MG PO TABS
20.0000 mg | ORAL_TABLET | Freq: Every day | ORAL | Status: DC
  Administered 2011-11-07 – 2011-11-13 (×7): 20 mg via ORAL
  Filled 2011-11-06 (×7): qty 1

## 2011-11-06 NOTE — Progress Notes (Addendum)
[facility]   Post-op EVAR Date of Surgery: 11/01/2011 - 11/03/2011 Surgeon: Moishe Spice): [name], MD POD: 3 Days Post-Op fasciotomy Device:  5 days post EVAR with revision History of Present Illness  Lisa Nichols is a 78 y.o. female who is s/p EVAR. The patient denies back pain; denies abdominal pain; denies lower extremity pain.  He is Ambulating and taking PO well without nausea or vomiting. Pt. has not voided with foley out  IMAGING: No results found.  Significant Diagnostic Studies: CBC Lab Results  Component Value Date   WBC 8.2 11/05/2011   HGB 7.3* 11/05/2011   HCT 22.8* 11/05/2011   MCV 85.1 11/05/2011   PLT 92* 11/05/2011     BMET    Component Value Date/Time   NA 135 11/05/2011 0425   K 3.4* 11/05/2011 0425   CL 109 11/05/2011 0425   CO2 22 11/05/2011 0425   GLUCOSE 108* 11/05/2011 0425   BUN 23 11/05/2011 0425   CREATININE 0.98 11/05/2011 0425   CALCIUM 7.6* 11/05/2011 0425   GFRNONAA 51* 11/05/2011 0425   GFRAA 59* 11/05/2011 0425    COAG Lab Results  Component Value Date   INR 1.63* 11/02/2011   INR 1.00 10/25/2011   INR 1.12 12/19/2010   No results found for this basename: PTT     I/O last 3 completed shifts: In: 3644.2 [P.O.:960; I.V.:2025; Blood:659.2] Out: 3430 [Urine:3430] No data found.   Physical Examination  BP Readings from Last 3 Encounters:  11/06/11 155/52  11/06/11 155/52  11/06/11 155/52   Temp Readings from Last 3 Encounters:  11/06/11 98.2 F (36.8 C) Oral  11/06/11 98.2 F (36.8 C) Oral  11/06/11 98.2 F (36.8 C) Oral   SpO2 Readings from Last 3 Encounters:  11/06/11 98%  11/06/11 98%  11/06/11 98%   Pulse Readings from Last 3 Encounters:  11/06/11 68  11/06/11 68  11/06/11 68    General: A&O x 3, WDWN female in NAD Gait: Normal Pulmonary: normal non-labored breathing  Cardiac: RRR Abdomen: soft, NT, NABS Bilateral groin wounds: clean, dry, intact, without  hematoma Vascular Exam/Pulses: left PT palp, skin warm and soft left LE. Left dressing in place  Wounds post fasciotomy are clean dry and intact.  Skin and muscle tissue are soft and no edema is present.   Extremities without ischemic changes, no Gangrene , no cellulitis; no open wounds;   Neurologic: A&O X 3; Appropriate Affect  Assessment: Lisa Nichols is a 78 y.o. female who is 3 Days Post-Op EVAR.  Pt is doing well with no complaints  Plan: Skilled nursing facility  The importance of surveillance of the endograft was discussed with the patient  A CTA of abdomen and pelvis will be scheduled for one month to assess for endoleak.  The patient will follow up with [place] in one month with these studies. Patient has AFO in room and we will order a prafo boot for in the bed to keep her ankle are 90 degrees and protect her heel.   CBC, BMET pending after transfusion yesterday. Will D?C foley today to prevent UTI. Will discuss closure plans with Dr. Myra Gianotti.  Signed: [facility] [phone] 11/06/2011 7:31 AM.   Excellent response to PRBC Sensation to left leg slightly improved.  Plan fasciotomy closure at bedside in the morning Spoke with grandson, Lisa Nichols for 10 minutes and updated him on everything  Durene Cal

## 2011-11-06 NOTE — Evaluation (Signed)
Clinical/Bedside Swallow Evaluation Patient Details  Name: Lisa Nichols MRN: [id number] DOB: 1923/09/30 Today's Date: 11/06/2011  Past Medical History:  Past Medical History  Diagnosis Date  . CAD (coronary artery disease)   . Anemia   . AAA (abdominal aortic aneurysm)   . History of syncope   . Compression fracture of thoracic vertebra 2008    T7- T8  . Cholelithiases   . Carotid artery occlusion   . Diverticulosis of colon   . Hiatal hernia   . CHF (congestive heart failure)   . Kyphosis   . Blood transfusion   . Urinary tract infection     hx of  . Diabetes mellitus   . Bronchitis     hx of  . Hypertension     sees Dr. Iran Ouch, primary doctor   . COPD (chronic obstructive pulmonary disease)     on O2 at 2 L [place] continuous sees Dr. Sherril Croon  . Shortness of breath    Past Surgical History:  Past Surgical History  Procedure Date  . Spine surgery     kyphoplasty  . Cardiac catheterization 2009  . Abdominal hysterectomy   . Bladder suspension   . Dilation and curettage of uterus   . Fasciotomy 11/03/2011    Procedure: FASCIOTOMY;  Surgeon: [name], MD;  Location: [facility] OR;  Service: Vascular;  Laterality: Left;  left leg   HPI:  Lisa Nichols is a 78 y.o. female who underwent EVAR 11/01/11. Postoperatively she had ischemia of her left leg and was taken back for repair of the left common femoral artery 2/22. On the morning of 2/23 it was noted that she had a foot drop on the left and had moderate swelling of her left calf. She was taken urgently for fasciotomy, and was intubated for the procedure. Pt has a h/o hiatal hernia and GERD, and reported that she decided to stop taking her reflux medications. On 2/25 per RN, pt was eating dinner when she began choking on her food, and had a gurgling sound in her throat. Pt was unable to expectorate pos, and a yankauer was used to clear the airway. Pt then coughed up the food that she had consumed during her meal. SpO2 dropped  to 88% during this episode, and increased to 93% when the pos were cleared. A bedside swallow eval was ordered to assess swallowing function.   Assessment/Recommendations/Treatment Plan Suspected Esophageal Findings: Belching  SLP Assessment Clinical Impression Statement: Pt presents with no overt s/s of aspiration, and oropharyngeal swallow appears WFL. Given h/o GERD and hiatal hernia, question choking episode 2/25 related to possible esophageal component. However, given no other incidence of difficulty and seemingly normal oropharyngeal function, recommend resuming a dys 3 (mechanical soft) diet and thin liquids with use of reflux precautions and f/u from SLP to monitor tolerance. Pending performance at bedside, further GI workup could be beneficial. Risk for Aspiration: Mild Other Related Risk Factors: History of GERD;History of esophageal-related issues (hiatal hernia)  Swallow Evaluation Recommendations Solid Consistency: Dysphagia 3 (Mechanical soft) (due to pt c/o food getting stuck in her dentures) Liquid Consistency: Thin Liquid Administration via: Cup;No straw Medication Administration: Whole meds with liquid Supervision: Patient able to self feed;Full supervision/cueing for compensatory strategies Compensations: Slow rate;Small sips/bites;Follow solids with liquid Postural Changes and/or Swallow Maneuvers: Seated upright 90 degrees;Upright 30-60 min after meal Oral Care Recommendations: Oral care BID Follow up Recommendations: Skilled Nursing facility  Treatment Plan Treatment Plan Recommendations: Therapy as outlined in treatment plan  below Speech Therapy Frequency: min 2x/week Treatment Duration: 2 weeks Interventions: Aspiration precaution training;Compensatory techniques;Patient/family education;Diet toleration management by SLP;Trials of upgraded texture/liquids  Prognosis Prognosis for Safe Diet Advancement: Good  Individuals Consulted Consulted and Agree with Results  and Recommendations: Patient;RN  Swallowing Goals  SLP Swallowing Goals Patient will consume recommended diet without observed clinical signs of aspiration with: Modified independent assistance Swallow Study Goal #1 - Progress: Not Met Patient will utilize recommended strategies during swallow to increase swallowing safety with: Modified independent assistance Swallow Study Goal #2 - Progress: Not met   [name] 11/06/2011,1:50 PM   [name], SLP Student

## 2011-11-06 NOTE — Progress Notes (Signed)
Orthopedic Tech Progress Note Patient Details:  Lisa Nichols 1924/03/27 [id number]  Other Ortho Devices Type of Ortho Device: Other (comment) Ortho Device Location: Prafo boot to let foot. Ortho Device Interventions: Application   Gaye Pollack 11/06/2011, 8:58 AM

## 2011-11-06 NOTE — Progress Notes (Signed)
Physical Therapy Treatment Patient Details Name: Lisa Nichols MRN: [id number] DOB: [birth year] Today's Date: 11/06/2011  PT Assessment/Plan  PT - Assessment/Plan Comments on Treatment Session: 78 year old from SNF admitted for Lt. LE fasciotomy. Performed transfer and ambulation today with AFO donned. Pt with much improved Lt. LE control, no excessive inversion noted and she is able to barely clear her Lt. foot for a stepping. Close monitoring of skin of Lt. foot needs to be performed with use of AFO given  pt's decreased sensation, pt aware. PT Plan: Discharge plan remains appropriate PT Frequency: Min 3X/week Follow Up Recommendations: Skilled nursing facility;Supervision/Assistance - 24 hour Equipment Recommended: Defer to next venue PT Goals  Acute Rehab PT Goals Pt will go Supine/Side to Sit: with supervision PT Goal: Supine/Side to Sit - Progress: Progressing toward goal Pt will go Sit to Stand: with supervision PT Goal: Sit to Stand - Progress: Progressing toward goal Pt will go Stand to Sit: with supervision PT Goal: Stand to Sit - Progress: Progressing toward goal Pt will Transfer Bed to Chair/Chair to Bed: with min assist PT Transfer Goal: Bed to Chair/Chair to Bed - Progress: Progressing toward goal Pt will Ambulate: 16 - 50 feet;with min assist;with rolling walker PT Goal: Ambulate - Progress: Progressing toward goal  PT Treatment Precautions/Restrictions  Precautions Precautions: Fall Required Braces or Orthoses: Yes Other Brace/Splint: Not required but advise Lt. ankle foot orthotic secondary Lt. foot drop and inversion (to prevent ligamentous damage) Restrictions Other Position/Activity Restrictions: Weightbearing as tolerated per MD. Mobility (including Balance) Bed Mobility Bed Mobility: Yes Left Sidelying to Sit: 3: Mod assist Supine to Sit: 3: Mod assist;HOB elevated (Comment degrees) (40 degrees) Supine to Sit Details (indicate cue type and reason): Verbal  cues for sequencing and efficiency. Assist for bil. LEs, more assist required for Lt. LE.  Sitting - Scoot to Edge of Bed: 4: Min assist Sitting - Scoot to Edge of Bed Details (indicate cue type and reason): Verbal cues to initiate, assist through pelvis.  Transfers Transfers: Yes Sit to Stand: 1: +2 Total assist;Patient percentage (comment);From bed;From chair/3-in-1 (pt = 75%) Sit to Stand Details (indicate cue type and reason): Verbal cues for anterior translation, UE positioning. Pt demonstrating improved ankle/Lt. knee stability while wearing Lt. AFO. Pt requires RW to obtain full standing. Facilitation of improved hip extension through pelvis.   Stand to Sit: To chair/3-in-1;1: +2 Total assist;Patient percentage (comment) (pt = 30%) Stand to Sit Details: PT to lower pt safely as she was too fatigued to reach for chair - leaving her UEs on RW. Verbal cues for correct and safe sequencing.  Stand Pivot Transfers: 3: Mod assist Stand Pivot Transfer Details (indicate cue type and reason): Verbal cues for LE sequencing, upright posture, navigation of RW. PT to assist with Lt. LE as pt with difficulty advancing Lt. LE.  Ambulation/Gait Ambulation/Gait: Yes (pt unable today) Ambulation/Gait Assistance: 3: Mod assist Ambulation/Gait Assistance Details (indicate cue type and reason): Assist for advancement of Lt. LE as well as to keep entire Lt. LE in neutral (tends to externally rotate). Max encouragement required for ambulation. Pt with decreased ability to weightbear through Lt. LE. Ambulation Distance (Feet): 7 Feet Assistive device: Rolling walker Gait Pattern: Step-to pattern;Decreased step length - right;Decreased step length - left;Decreased stance time - left;Trunk flexed (minimal foot clearance bilaterally) Stairs: No  Posture/Postural Control Posture/Postural Control: Postural limitations Postural Limitations: Significant kyphosis Balance Balance Assessed: No Exercise  General  Exercises - Lower Extremity Ankle Circles/Pumps: AROM;AAROM;Left;10 reps;Seated (  heels unweighted) End of Session PT - End of Session Equipment Utilized During Treatment: Gait belt Activity Tolerance: Patient limited by fatigue Patient left: in chair;with call bell in reach Nurse Communication: Mobility status for transfers;Mobility status for ambulation;Other (comment) (use of PRAFO and AFO) General Behavior During Session: [facility] for tasks performed Cognition: Impaired Cognitive Impairment: Decreased processing, expected for age  Sherie Don 11/06/2011, 1:17 PM  Sherie Don) Carleene Mains PT, DPT Acute Rehabilitation [phone]

## 2011-11-06 NOTE — Progress Notes (Signed)
Pt had 2.4 second pause, then returned to sinus rhythm 50s-60s.  Pt asymptomatic sitting up in chair watching tv.  Lianne Cure, PA notified.  No new orders received, will continue to monitor.  Roselie Awkward, RN

## 2011-11-06 NOTE — Progress Notes (Signed)
Utilization review completed. Elyna Pangilinan, RN, BSN. 11/06/11 

## 2011-11-07 ENCOUNTER — Other Ambulatory Visit: Payer: Self-pay

## 2011-11-07 NOTE — Progress Notes (Signed)
Speech Pathology: Dysphagia Treatment Note  Subjective: Awake, sitting upright in bed   Objective: Nurse tech present and assisting patient with breakfast tray.  Patient self-feeding at a slow rate.  SLP provided occasional verbal cues, initially, to alternate liquid sips after solid swallows.  By the session's end, patient was independently carrying this strategy through with each bite.  No overt clinical indicators of an oral-pharyngeal phase dysphagia.  Aware of evaluating SLP's concern for esophageal component.  Completed education on strategies with patient and nurse tech and downgraded her supervision level to intermittent as full supervision is not necessary.  Assessment: Demonstrates a functional oral-pharyngeal swallow.  No further skilled SLP services at this time.  Recommendations:  1. Continue Dys.3/thin liquid diet 2. Alternate solids and liquids to maximize esophageal clearance 3. No further skilled SLP services  Pain:   none Intervention Required:   No  Goals: All Goals Met  Myra Rude, M.S.,CCC-SLP Pager [phone]

## 2011-11-07 NOTE — Progress Notes (Addendum)
[facility] [place]  Post-op EVAR Date of Surgery: 11/01/2011 - 11/03/2011 Surgeon: Moishe Spice): [name], MD POD: 4 Days Post-Op Device:eVAR and then fasciotomy left lower leg.  History of Present Illness  Lisa Nichols is a 78 y.o. female who is s/p EVAR. The patient no complains of back pain; denies abdominal pain; complains of lower extremity pain.  He is Ambulating and taking PO well without nausea or vomiting. Pt. has not voided with foley out  IMAGING: Dg Chest Port 1 View  11/06/2011  *RADIOLOGY REPORT*  Clinical Data: COPD  PORTABLE CHEST - 1 VIEW  Comparison: 11/02/2011  Findings: There are new patchy airspace opacities in the left lower lobe.  Question small left pleural effusion.  Coarse interstitial markings throughout the remainder of the lung fields are largely stable.  Heart size within normal limits for technique. Atheromatous aortic arch.  Changes of vertebral augmentation in the lower thoracic spine.  IMPRESSION:  1.  New left lower lobe patchy airspace opacities suggesting pneumonia. 2.  Question small left pleural effusion.  Original Report Authenticated By: Osa Craver, M.D.    Significant Diagnostic Studies: CBC Lab Results  Component Value Date   WBC 9.6 11/06/2011   HGB 12.0 11/06/2011   HCT 35.8* 11/06/2011   MCV 84.6 11/06/2011   PLT 101* 11/06/2011     BMET    Component Value Date/Time   NA 134* 11/06/2011 0910   K 3.1* 11/06/2011 0910   CL 104 11/06/2011 0910   CO2 23 11/06/2011 0910   GLUCOSE 156* 11/06/2011 0910   BUN 18 11/06/2011 0910   CREATININE 0.90 11/06/2011 0910   CALCIUM 7.8* 11/06/2011 0910   GFRNONAA 57* 11/06/2011 0910   GFRAA 66* 11/06/2011 0910    COAG Lab Results  Component Value Date   INR 1.63* 11/02/2011   INR 1.00 10/25/2011   INR 1.12 12/19/2010   No results found for this basename: PTT     I/O last 3 completed shifts: In: 2655 [I.V.:2325; Blood:330] Out: 3610 [Urine:3610] No data  found.   Physical Examination  BP Readings from Last 3 Encounters:  11/07/11 168/49  11/07/11 168/49  11/07/11 168/49   Temp Readings from Last 3 Encounters:  11/07/11 98.5 F (36.9 C) Oral  11/07/11 98.5 F (36.9 C) Oral  11/07/11 98.5 F (36.9 C) Oral   SpO2 Readings from Last 3 Encounters:  11/07/11 99%  11/07/11 99%  11/07/11 99%   Pulse Readings from Last 3 Encounters:  11/07/11 69  11/07/11 69  11/07/11 69    General: A&O x 3, WDWN female in NAD Gait: Normal Pulmonary: normal non-labored breathing  Cardiac: RRR Abdomen: soft, NT, NABS Bilateral groin wounds: clean, dry, intact, with hematoma Vascular Exam/Pulses:palp pulses bil. DP/PT S/P primary fasciotomy closure at bed side by Dr. Myra Gianotti.  Calf soft, no erythema.  Extremities without ischemic changes, no Gangrene , no cellulitis; no open wounds;   Neurologic: A&O X 3; Appropriate Affect  Assessment: [name] is a 78 y.o. female who is 4 Days Post-Op EVAR.  Pt is doing well with no complaints other than lower leg pain with primary closure.  Calf soft.  Plan: Skilled nursing facility   The importance of surveillance of the endograft was discussed with the patient  A CTA of abdomen and pelvis will be scheduled for one month to assess for endoleak.  The patient will follow up with [place] in one month with these studies. Primary closure s/p fasciotomy  completed at bed side today.  Will plan d/c in the next week. I will do daily dressing changes. Foley to gravity. SignedMosetta Pigeon [phone] 11/07/2011 8:59 AM.   fasciotomies closed today without complication Still with foot drop.  Durene Cal

## 2011-11-08 LAB — CBC
Hemoglobin: 11.3 g/dL — ABNORMAL LOW (ref 12.0–15.0)
MCH: 27.9 pg (ref 26.0–34.0)
RBC: 4.05 MIL/uL (ref 3.87–5.11)

## 2011-11-08 LAB — BASIC METABOLIC PANEL
CO2: 24 mEq/L (ref 19–32)
Calcium: 8.4 mg/dL (ref 8.4–10.5)
Glucose, Bld: 147 mg/dL — ABNORMAL HIGH (ref 70–99)
Sodium: 137 mEq/L (ref 135–145)

## 2011-11-08 NOTE — Progress Notes (Addendum)
[facility] [place]  Post-op EVAR Date of Surgery: 11/01/2011 - 11/03/2011 Surgeon: Moishe Spice): [name], MD POD: 5 Days Post-Op Post fasciotomy left LE 4 days and s/p 1 day primary closure. Device:   History of Present Illness  Lisa Nichols is a 78 y.o. female who is s/p EVAR. The patient denies back pain; denies abdominal pain; denies lower extremity pain.  He is Ambulating and taking PO well without nausea or vomiting. Pt. has not voided with foley out  IMAGING: No results found.  Significant Diagnostic Studies: CBC Lab Results  Component Value Date   WBC 9.6 11/06/2011   HGB 12.0 11/06/2011   HCT 35.8* 11/06/2011   MCV 84.6 11/06/2011   PLT 101* 11/06/2011     BMET    Component Value Date/Time   NA 134* 11/06/2011 0910   K 3.1* 11/06/2011 0910   CL 104 11/06/2011 0910   CO2 23 11/06/2011 0910   GLUCOSE 156* 11/06/2011 0910   BUN 18 11/06/2011 0910   CREATININE 0.90 11/06/2011 0910   CALCIUM 7.8* 11/06/2011 0910   GFRNONAA 57* 11/06/2011 0910   GFRAA 66* 11/06/2011 0910    COAG Lab Results  Component Value Date   INR 1.63* 11/02/2011   INR 1.00 10/25/2011   INR 1.12 12/19/2010   No results found for this basename: PTT     I/O last 3 completed shifts: In: 3435 [P.O.:960; I.V.:2475] Out: 1305 [Urine:1305] No data found.   Physical Examination  BP Readings from Last 3 Encounters:  11/08/11 156/55  11/08/11 156/55  11/08/11 156/55   Temp Readings from Last 3 Encounters:  11/08/11 98.5 F (36.9 C) Oral  11/08/11 98.5 F (36.9 C) Oral  11/08/11 98.5 F (36.9 C) Oral   SpO2 Readings from Last 3 Encounters:  11/08/11 98%  11/08/11 98%  11/08/11 98%   Pulse Readings from Last 3 Encounters:  11/08/11 73  11/08/11 73  11/08/11 73    General: A&O x 3, WDWN female in NAD Gait: Normal Pulmonary: normal non-labored breathing  Cardiac: RRR Abdomen: soft, NT, NABS Bilateral groin wounds: clean, dry, intact, without  hematoma Vascular Exam/Pulses:palp bil LE pulses.   Extremities without ischemic changes, no Gangrene , no cellulitis; no open wounds;  Right fasciotomy incisions D/D/I.  No erythema or edema.  Calf and anterior tib soft. Min., but improved AROM of toes and ankle.  Palpable anterior tib contraction left LE. Neurologic: A&O X 3; Appropriate Affect  Assessment: Lisa Nichols is a 78 y.o. female who is 5 Days Post-Op EVAR.  Pt is doing well with no complaints  Plan: Nursing Home  The importance of surveillance of the endograft was discussed with the patient  A CTA of abdomen and pelvis will be scheduled for one month to assess for endoleak.  The patient will follow up with [place] in one month with these studies. Transfer to 2000 Trial D/C foley today. Cont. PT for mobiltiy.  SignedMosetta Pigeon [phone] 11/08/2011 7:29 AM. Agree with above  Pt with some plantar and dorsi flexion of right ankle today.   States she does not feel well  Will check CBC and BMET OK to tx to 2000 D/C foley  [place] Macie Baum

## 2011-11-08 NOTE — Progress Notes (Signed)
PT Cancellation Note  Treatment cancelled today due to patient's refusal to participate. Despite multiple attempts at encouragement, Patient stated, "I hate therapy and I hate the man that invented it". Will attempt later this week as time allows.  Fredrich Birks 11/08/2011, 10:12 AM  11/08/2011 Fredrich Birks PTA [phone] pager [phone] office

## 2011-11-08 NOTE — Progress Notes (Signed)
11/07/2011  2350  Pt c/o sharp midsternal CP radiating to left arm 10/10. EKG done. CP subsided with 2 subliguinal tablets. E-link camera in and Dr. Hart Rochester called. No new orders received. Vital signs stable. Pt resting in bed with her eyes closed. Will continue to monitor.  [place] Justus Duerr, RN

## 2011-11-09 MED ORDER — POTASSIUM CHLORIDE CRYS ER 20 MEQ PO TBCR
EXTENDED_RELEASE_TABLET | ORAL | Status: AC
  Administered 2011-11-09: 20 meq via ORAL
  Filled 2011-11-09: qty 1

## 2011-11-09 MED ORDER — POTASSIUM CHLORIDE IN NACL 20-0.9 MEQ/L-% IV SOLN: INTRAVENOUS | Status: DC

## 2011-11-09 MED ORDER — POTASSIUM CHLORIDE CRYS ER 20 MEQ PO TBCR
20.0000 meq | EXTENDED_RELEASE_TABLET | ORAL | Status: AC
  Administered 2011-11-09 (×2): 20 meq via ORAL
  Filled 2011-11-09: qty 1

## 2011-11-09 NOTE — Progress Notes (Signed)
Repositioned pt q2hr today from side to side and back.

## 2011-11-09 NOTE — Progress Notes (Signed)
[facility] [place]  Post-op EVAR Date of Surgery: 11/01/2011 - 11/03/2011 Surgeon: Moishe Spice): [name], MD POD: 6 Days Post-Op EVAR and fasciotomy with primary closure times 1 day.   History of Present Illness  Lisa Nichols is a 78 y.o. female who is s/p EVAR. The patient denies back pain; denies abdominal pain; denies lower extremity pain.  He is Ambulating and taking PO well without nausea or vomiting. Pt. has not voided with foley out  IMAGING: No results found.  Significant Diagnostic Studies: CBC Lab Results  Component Value Date   WBC 11.4* 11/08/2011   HGB 11.3* 11/08/2011   HCT 34.9* 11/08/2011   MCV 86.2 11/08/2011   PLT 122* 11/08/2011     BMET    Component Value Date/Time   NA 137 11/08/2011 1314   K 3.0* 11/08/2011 1314   CL 108 11/08/2011 1314   CO2 24 11/08/2011 1314   GLUCOSE 147* 11/08/2011 1314   BUN 17 11/08/2011 1314   CREATININE 0.79 11/08/2011 1314   CALCIUM 8.4 11/08/2011 1314   GFRNONAA 74* 11/08/2011 1314   GFRAA 86* 11/08/2011 1314    COAG Lab Results  Component Value Date   INR 1.63* 11/02/2011   INR 1.00 10/25/2011   INR 1.12 12/19/2010   No results found for this basename: PTT     I/O last 3 completed shifts: In: 1515 [P.O.:840; I.V.:675] Out: 1450 [Urine:1450] No data found.   Physical Examination  BP Readings from Last 3 Encounters:  11/09/11 156/74  11/09/11 156/74  11/09/11 156/74   Temp Readings from Last 3 Encounters:  11/09/11 98.2 F (36.8 C) Oral  11/09/11 98.2 F (36.8 C) Oral  11/09/11 98.2 F (36.8 C) Oral   SpO2 Readings from Last 3 Encounters:  11/09/11 93%  11/09/11 93%  11/09/11 93%   Pulse Readings from Last 3 Encounters:  11/09/11 74  11/09/11 74  11/09/11 74    General: A&O x 3, WDWN female in NAD Gait: Normal Pulmonary: normal non-labored breathing  Cardiac: RRR Abdomen: soft, NT, NABS Bilateral groin wounds: clean, dry, intact, without hematoma Vascular  Exam/Pulses:palp DP/PT pulses intact bil. Left foot cap refill brisk and skin in warm and dry. Plantar AROM toes and ankle intact, contraction of anterior tibialis palp.  absent dorsiflex left LE.  Extremities without ischemic changes, no Gangrene , no cellulitis; no open wounds;   Neurologic: A&O X 3; Appropriate Affect  Assessment: Lisa Nichols is a 78 y.o. female who is 6 Days Post-Op EVAR.  Pt is doing well with no complaints  Plan: Skilled nursing facility  The importance of surveillance of the endograft was discussed with the patient  A CTA of abdomen and pelvis will be scheduled for one month to assess for endoleak.  The patient will follow up with [place] in one month with these studies. Patient unable to void independently.  WE have D/C'd the foley twice and patient unable, without urgency to void.  Will discuss plan with Dr. Myra Gianotti.  SignedClinton Gallant [facility] [phone] 11/09/2011 7:57 AM.

## 2011-11-09 NOTE — Progress Notes (Signed)
Utilization review completed. Shayma Pfefferle, RN, BSN. 11/09/11 

## 2011-11-09 NOTE — Progress Notes (Signed)
Pt has no spontaneous  urine output for 12 hours after foley was discontinued,bladder scan done with residual volume of 364 cc.Dr Imogene Burn made aware with order to put foley catheter back.Will continue to monitor.  Griffin Dewilde Jamey Reas RN.

## 2011-11-09 NOTE — Progress Notes (Signed)
PT Cancellation Note  Treatment cancelled today due to patient's refusal to participate. Max encouragement for participation today but pt wouldn't even open her eyes to look at me. Reports therapy just "messes everything up." Encouraged OOB activity for improved pulmonary function and to eat lunch but pt very resistant. Advised pt that we cannot keep coming if she keeps refusing [place] and she replied "then don't come." Will try one more time for therapy. If another refusal we will have to sign off.   [facility] HELEN 11/09/2011, 11:28 AM Pager: [phone]

## 2011-11-09 NOTE — Progress Notes (Signed)
Encouraged pt multiple times to ambulate. Pt refused. Said she will "walk when the notion hits me." Also commented that she felt she was not getting proper care. Continue to assure her that walking was what the doctor wanted and laying in bed would only cause her to become more weak. Pt still refused. Will continue to encourage.

## 2011-11-10 LAB — CBC
HCT: 33.8 % — ABNORMAL LOW (ref 36.0–46.0)
Hemoglobin: 10.9 g/dL — ABNORMAL LOW (ref 12.0–15.0)
MCH: 28.3 pg (ref 26.0–34.0)
MCV: 87.8 fL (ref 78.0–100.0)
Platelets: 170 10*3/uL (ref 150–400)
RBC: 3.85 MIL/uL — ABNORMAL LOW (ref 3.87–5.11)

## 2011-11-10 LAB — BASIC METABOLIC PANEL
BUN: 15 mg/dL (ref 6–23)
CO2: 24 mEq/L (ref 19–32)
Calcium: 8.1 mg/dL — ABNORMAL LOW (ref 8.4–10.5)
Creatinine, Ser: 0.83 mg/dL (ref 0.50–1.10)
Glucose, Bld: 110 mg/dL — ABNORMAL HIGH (ref 70–99)

## 2011-11-10 MED ORDER — ENSURE CLINICAL ST REVIGOR PO LIQD
237.0000 mL | Freq: Two times a day (BID) | ORAL | Status: DC
  Administered 2011-11-10 – 2011-11-13 (×4): 237 mL via ORAL

## 2011-11-10 NOTE — Progress Notes (Signed)
INITIAL ADULT NUTRITION ASSESSMENT Date: 11/10/2011   Time: 1:11 PM  Reason for Assessment: MD Consult for poor appetite and poor PO intake  ASSESSMENT: Female 78 y.o.  Dx: AAA; S/P ABDOMINAL AORTIC ENDOVASCULAR STENT GRAFT 2/22  Hx:  Past Medical History  Diagnosis Date  . CAD (coronary artery disease)   . Anemia   . AAA (abdominal aortic aneurysm)   . History of syncope   . Compression fracture of thoracic vertebra 2008    T7- T8  . Cholelithiases   . Carotid artery occlusion   . Diverticulosis of colon   . Hiatal hernia   . CHF (congestive heart failure)   . Kyphosis   . Blood transfusion   . Urinary tract infection     hx of  . Diabetes mellitus   . Bronchitis     hx of  . Hypertension     sees Dr. Iran Ouch, primary doctor   . COPD (chronic obstructive pulmonary disease)     on O2 at 2 L [facility] continuous sees Dr. Sherril Croon  . Shortness of breath     Related Meds:  Scheduled Meds:   . aspirin  81 mg Oral Daily  . docusate sodium  100 mg Oral Daily  . famotidine  20 mg Oral Daily  . metoprolol tartrate  25 mg Oral TID  . potassium chloride  20 mEq Oral Q2H   Continuous Infusions:   . dextrose 5 % and 0.9% NaCl 75 mL/hr at 11/09/11 2028  . DOPamine     PRN Meds:.acetaminophen, acetaminophen, albuterol, guaiFENesin-dextromethorphan, hydrALAZINE, HYDROmorphone (DILAUDID) injection, labetalol, metoprolol, nitroGLYCERIN, ondansetron, oxyCODONE-acetaminophen, phenol   Ht: 4\' 10"  (147.3 cm)  Wt: 98 lb (44.5 kg) 2 weeks ago  Ideal Wt: 44 kg % Ideal Wt: 101%  Weight of 123 lb likely skewed with boot on patients leg, blankets, equipment on bed.  Wt Readings from Last 12 Encounters:  11/10/11 123 lb 6.4 oz (55.974 kg)  11/10/11 123 lb 6.4 oz (55.974 kg)  11/10/11 123 lb 6.4 oz (55.974 kg)  11/10/11 123 lb 6.4 oz (55.974 kg)  10/25/11 98 lb 3.2 oz (44.543 kg)  10/15/11 96 lb (43.545 kg)   Usual Wt: 96-98 lb % Usual Wt: 100%  BMI=20.5 Normal  Weight  Food/Nutrition Related Hx: Patient reports poor appetite since admission.  Does not drink juices because they give her indigestion.  Has drank Ensure in the past, but doesn't really like it.  Labs:  CMP     Component Value Date/Time   NA 141 11/10/2011 0655   K 3.4* 11/10/2011 0655   CL 112 11/10/2011 0655   CO2 24 11/10/2011 0655   GLUCOSE 110* 11/10/2011 0655   BUN 15 11/10/2011 0655   CREATININE 0.83 11/10/2011 0655   CALCIUM 8.1* 11/10/2011 0655   PROT 7.7 10/25/2011 1501   ALBUMIN 2.9* 10/25/2011 1501   AST 14 10/25/2011 1501   ALT 6 10/25/2011 1501   ALKPHOS 84 10/25/2011 1501   BILITOT 0.2* 10/25/2011 1501   GFRNONAA 63* 11/10/2011 0655   GFRAA 72* 11/10/2011 0655    Intake/Output Summary (Last 24 hours) at 11/10/11 1320 Last data filed at 11/10/11 0856  Gross per 24 hour  Intake    360 ml  Output    700 ml  Net   -340 ml    Diet Order: Dysphagia 3 thin liquids  Supplements/Tube Feeding: None  IVF:    dextrose 5 % and 0.9% NaCl Last Rate: 75 mL/hr at 11/09/11  2028  DOPamine     Estimated Nutritional Needs:   Kcal: 1100-1300 Protein: 55-65 grams Fluid: 1.1-1.3 liters  Patient ordered some soup for lunch today.  Does well with soft foods like mashed potatoes, soups, fruit.  Having difficulty with foods that aren't soft, such as meats.  NUTRITION DIAGNOSIS: -Inadequate oral intake (NI-2.1).  Status: Ongoing  RELATED TO: difficulty chewing   AS EVIDENCED BY: minimal intake of meals (</=50% meal completion)  MONITORING/EVALUATION(Goals): Intake to meet at least 90% of estimated nutrition needs.  EDUCATION NEEDS: -Education needs addressed; discussed ways to increase intake of calories and protein.  INTERVENTION:  Magic Cups on all meal trays.  Ensure Clinical Strength BID.  Add chopped meats to diet order.  Dietitian #:  [phone]  DOCUMENTATION CODES Per approved criteria  -Not Applicable    Hettie Holstein 11/10/2011, 1:11 PM

## 2011-11-10 NOTE — Progress Notes (Addendum)
[facility] [place]  Post-op EVAR Date of Surgery: 11/01/2011 - 11/03/2011 Surgeon: Moishe Spice): [name], MD Juleen China, MD POD: 7 Days Post-Op EVAR, left fem endarterectomy and patch S/P fasciotomy left leg Device: gore excluder  History of Present Illness  Lisa Nichols is a 78 y.o. female who is s/p EVAR. The patient denies back pain; denies abdominal pain; denies left lower extremity pain. Able to move toes left foot. Not much dorsiflexion on left. Poor appetite. Pt likes soft foods Refusing PT  Significant Diagnostic Studies: CBC Lab Results  Component Value Date   WBC 8.6 11/10/2011   HGB 10.9* 11/10/2011   HCT 33.8* 11/10/2011   MCV 87.8 11/10/2011   PLT 170 11/10/2011     BMET    Component Value Date/Time   NA 141 11/10/2011 0655   K 3.4* 11/10/2011 0655   CL 112 11/10/2011 0655   CO2 24 11/10/2011 0655   GLUCOSE 110* 11/10/2011 0655   BUN 15 11/10/2011 0655   CREATININE 0.83 11/10/2011 0655   CALCIUM 8.1* 11/10/2011 0655   GFRNONAA 63* 11/10/2011 0655   GFRAA 72* 11/10/2011 0655    COAG Lab Results  Component Value Date   INR 1.63* 11/02/2011   INR 1.00 10/25/2011   INR 1.12 12/19/2010   No results found for this basename: PTT     I/O last 3 completed shifts: In: 1425 [P.O.:600; I.V.:825] Out: 1725 [Urine:1725] No data found.   Physical Examination  BP Readings from Last 3 Encounters:  11/10/11 169/58  11/10/11 169/58  11/10/11 169/58   Temp Readings from Last 3 Encounters:  11/10/11 98.9 F (37.2 C) Oral  11/10/11 98.9 F (37.2 C) Oral  11/10/11 98.9 F (37.2 C) Oral   SpO2 Readings from Last 3 Encounters:  11/10/11 96%  11/10/11 96%  11/10/11 96%   Pulse Readings from Last 3 Encounters:  11/10/11 77  11/10/11 77  11/10/11 77    General: A&O x 3, WDWN female in NAD Pulmonary: normal non-labored breathing  Cardiac: RRR Abdomen: soft, NT, NABS Bilateral groin wounds: clean, dry, intact, without  hematoma Vascular Exam/Pulses:palp DP bilat LLE foot warm cyanosis 4th and 5th toes Pos. Movement left toes, very min dorsiflexion  Assessment: Lisa Nichols is a 78 y.o. female who is 7 Days Post-Op EVAR.   Endarterectomy L CFA Facsiotomies Poor appetite_ nutrition consult Soft diet with pt preferences Cont dressing changes OOB - encouraged Pt. To work with PT  Signed: Marlowe Shores [phone] 11/10/2011 9:45 AM.   Increased movement toes left foot with slight sensation Very poor nutrition Terrible appetite Continue to work towards mobilization with physical therapy although patient not very cooperative

## 2011-11-10 NOTE — Progress Notes (Signed)
Pt. Refusing to get OOB this am. Will continue with q2h turns and encouraging patient to get OOB. Lisa Nichols, Chrystine Oiler

## 2011-11-11 ENCOUNTER — Other Ambulatory Visit: Payer: Self-pay | Admitting: Thoracic Diseases

## 2011-11-11 DIAGNOSIS — I714 Abdominal aortic aneurysm, without rupture: Secondary | ICD-10-CM

## 2011-11-11 LAB — BASIC METABOLIC PANEL
BUN: 13 mg/dL (ref 6–23)
Calcium: 8 mg/dL — ABNORMAL LOW (ref 8.4–10.5)
Creatinine, Ser: 0.75 mg/dL (ref 0.50–1.10)
GFR calc Af Amer: 87 mL/min — ABNORMAL LOW (ref >90)

## 2011-11-11 LAB — CBC
MCH: 28.3 pg (ref 26.0–34.0)
MCV: 88.2 fL (ref 78.0–100.0)
Platelets: 196 10*3/uL (ref 150–400)
RDW: 16.5 % — ABNORMAL HIGH (ref 11.5–15.5)

## 2011-11-11 NOTE — Progress Notes (Signed)
CSW received consult for SNF. Attempted to meet with pt but she was very lethargic and stated, "just leave." Attempted to contact pt's son and grandson and left vmsg requesting return call. Per RN, pt reports admitted from [facility]. Message left for admissions at SNF to confirm pt able to return. FL2 complete and on shadow chart for MD signature. Weekday CSW to f/u with [facility] re: bed availability.  Dellie Burns, MSW, [place] [phone] (weekend)

## 2011-11-11 NOTE — Progress Notes (Signed)
Patient still refusing to eat food or drink ensures. Doesn't want to get OOB. States " she wants to rest and for everyone to leave her alone." Will continue to monitor patient and offer encouragement. Yuepheng Schaller, Chrystine Oiler

## 2011-11-11 NOTE — Progress Notes (Signed)
Patient still refusing to eat, ordered foods that patient reported to like, but she still says she is not hungry, encouraging ensures. Will cont. To monitor. Salwa Bai, Chrystine Oiler

## 2011-11-11 NOTE — Progress Notes (Signed)
Patient had Bigeminy PVCs at 2324 HR 80. Will continue to monitor.

## 2011-11-11 NOTE — Progress Notes (Addendum)
[facility] [place]  Progress Note Bypass Surgery  Date of Surgery: 11/01/2011 - 11/03/2011  Procedure(s): EVAR/ left CFA patch angioplasty FASCIOTOMY left leg Surgeon: Surgeon(s): [name], MD  8 Days Post-Op  History of Present Illness  Lisa Nichols is a 78 y.o. female with poor appetite. She states her leg is sore. She is eating some breakfast. States she does not like ensure. Not cooperative with PT. Will Need SNF.  Significant Diagnostic Studies: CBC Lab Results  Component Value Date   WBC 7.0 11/11/2011   HGB 11.3* 11/11/2011   HCT 35.2* 11/11/2011   MCV 88.2 11/11/2011   PLT 196 11/11/2011    BMET @LASTCHEMISTRY @     Component Value Date/Time   NA 139 11/11/2011 0510   K 3.1* 11/11/2011 0510   CL 108 11/11/2011 0510   CO2 27 11/11/2011 0510   GLUCOSE 116* 11/11/2011 0510   BUN 13 11/11/2011 0510   CREATININE 0.75 11/11/2011 0510   CALCIUM 8.0* 11/11/2011 0510   GFRNONAA 75* 11/11/2011 0510   GFRAA 87* 11/11/2011 0510    COAG Lab Results  Component Value Date   INR 1.63* 11/02/2011   INR 1.00 10/25/2011   INR 1.12 12/19/2010   No results found for this basename: PTT    Physical Examination  BP Readings from Last 3 Encounters:  11/11/11 168/77  11/11/11 168/77  11/11/11 168/77   Temp Readings from Last 3 Encounters:  11/11/11 98.3 F (36.8 C) Oral  11/11/11 98.3 F (36.8 C) Oral  11/11/11 98.3 F (36.8 C) Oral   SpO2 Readings from Last 3 Encounters:  11/11/11 95%  11/11/11 95%  11/11/11 95%   Pulse Readings from Last 3 Encounters:  11/11/11 82  11/11/11 82  11/11/11 82    Pt is A&O x 3 left lower extremity:groin Incision/s is/are clean,dry.intact, and  healing without hematoma, erythema or drainage Limb is warm; with good color  Assessment: Pt. stable Post-op pain is controlled  Plan: CSW for DC planning to SNF PT/OT for ambulation Continue wound care as ordered  Marlowe Shores [phone] 11/11/2011 8:05  AM        Agree with above Eating poorly DC planning to return to SNF

## 2011-11-11 NOTE — Progress Notes (Signed)
Patient said she could not breathe and was coughing. Patient has nonproductive cough. Respiratory therapy was called and a prn albuterol treatment was administered. Patient was readjusted in bed. Will continue to monitor.

## 2011-11-12 LAB — BASIC METABOLIC PANEL
Chloride: 105 mEq/L (ref 96–112)
Creatinine, Ser: 0.72 mg/dL (ref 0.50–1.10)
GFR calc Af Amer: 88 mL/min — ABNORMAL LOW (ref >90)
GFR calc non Af Amer: 76 mL/min — ABNORMAL LOW (ref >90)
Potassium: 3.2 mEq/L — ABNORMAL LOW (ref 3.5–5.1)

## 2011-11-12 LAB — CBC
MCHC: 32.3 g/dL (ref 30.0–36.0)
MCV: 87.8 fL (ref 78.0–100.0)
Platelets: 224 10*3/uL (ref 150–400)
RDW: 16.5 % — ABNORMAL HIGH (ref 11.5–15.5)
WBC: 7.8 10*3/uL (ref 4.0–10.5)

## 2011-11-12 NOTE — Progress Notes (Addendum)
[facility] Progress Note  11/12/2011 8:16 AM POD 9/8  Subjective:  No complaints this am.  Still not hungry.   Filed Vitals:   11/12/11 0500  BP: 157/70  Pulse: 78  Temp:   Resp:     Physical Exam: Incisions:  Bilateral fasciotomy incisions look good with no evidence of infection. Extremities:  Left calf soft.  4th and 5th toes still discolored, but not worsened. Left foot still with foot drop.  CBC    Component Value Date/Time   WBC 7.8 11/12/2011 0455   RBC 4.09 11/12/2011 0455   HGB 11.6* 11/12/2011 0455   HCT 35.9* 11/12/2011 0455   PLT 224 11/12/2011 0455   MCV 87.8 11/12/2011 0455   MCH 28.4 11/12/2011 0455   MCHC 32.3 11/12/2011 0455   RDW 16.5* 11/12/2011 0455   LYMPHSABS 2.0 10/25/2011 1501   MONOABS 0.9 10/25/2011 1501   EOSABS 0.3 10/25/2011 1501   BASOSABS 0.0 10/25/2011 1501    BMET    Component Value Date/Time   NA 139 11/12/2011 0455   K 3.2* 11/12/2011 0455   CL 105 11/12/2011 0455   CO2 27 11/12/2011 0455   GLUCOSE 86 11/12/2011 0455   BUN 14 11/12/2011 0455   CREATININE 0.72 11/12/2011 0455   CALCIUM 8.3* 11/12/2011 0455   GFRNONAA 76* 11/12/2011 0455   GFRAA 88* 11/12/2011 0455    INR    Component Value Date/Time   INR 1.63* 11/02/2011 1330     Intake/Output Summary (Last 24 hours) at 11/12/11 0816 Last data filed at 11/12/11 0525  Gross per 24 hour  Intake    320 ml  Output   1875 ml  Net  -1555 ml     Assessment/Plan:  78 y.o. female is s/p EVAR/ left CFA patch angioplasty  FASCIOTOMY left leg POD 9/8 -BP up this am--improved with hydralazine -failure to thrive-refuses to eat/mobilize. -dressings changed on LLE.  Continue boot to left foot. -continue to encourage pt to eat and mobilize -continue PT  Newton Pigg, PA-C [facility] [phone] 11/12/2011 8:16 AM

## 2011-11-12 NOTE — Progress Notes (Signed)
Physical Therapy Treatment Patient Details Name: Lisa Nichols MRN: [id number] DOB: 17-Nov-1923 Today's Date: 11/12/2011  PT Assessment/Plan  PT - Assessment/Plan Comments on Treatment Session: s/p Lt LE fasciotomy. Has been consistently refusing PT the past few sessions. Agreeable today with encouragement but has become weaker since last session. Also noted to have a run of VT during our stand pivot transfer today with HR reaching 140s and runs of multiple PVCs. RN aware and pt not in any current distress per report.  PT Plan: Discharge plan remains appropriate;Frequency remains appropriate Follow Up Recommendations: Skilled nursing facility Equipment Recommended: Defer to next venue PT Goals  Acute Rehab PT Goals PT Goal: Supine/Side to Sit - Progress: Progressing toward goal Pt will go Sit to Stand: with min assist PT Goal: Sit to Stand - Progress: Revised due to lack of progress Pt will go Stand to Sit: with min assist PT Goal: Stand to Sit - Progress: Revised due to lack of progress PT Transfer Goal: Bed to Chair/Chair to Bed - Progress: Progressing toward goal PT Goal: Ambulate - Progress: Not progressing  PT Treatment Precautions/Restrictions  Precautions Precautions: Fall Required Braces or Orthoses: Yes Other Brace/Splint: Not required but advise Lt. ankle foot orthotic secondary Lt. foot drop and inversion (to prevent ligamentous damage) Restrictions Weight Bearing Restrictions: No Other Position/Activity Restrictions: WBAT Mobility (including Balance) Bed Mobility Supine to Sit: 3: Mod assist;HOB elevated (Comment degrees) (40 degrees) Supine to Sit Details (indicate cue type and reason): cues to sequence  Sitting - Scoot to Edge of Bed: 3: Mod assist Sitting - Scoot to Edge of Bed Details (indicate cue type and reason): use of pad to scoot Transfers Sit to Stand: 1: +2 Total assist;From bed;From chair/3-in-1;From elevated surface;With upper extremity assist (50%) Sit to  Stand Details (indicate cue type and reason): cues for safe hand placement and assist for follow through to stand; pt resistant to any wt bearing through LLE (kept PRAFO on pt's foot)  Stand to Sit: To chair/3-in-1;3: Mod assist Stand to Sit Details: assist to control descent to chair Stand Pivot Transfers: 1: +2 Total assist;With armrests (45%) Stand Pivot Transfer Details (indicate cue type and reason): max facilitation for hip/trunk extension as well as to negotiate steps with RW; pt keeping LLE out in front of pt despite cues and left knee tends to buckle (PRAFO on LLE during transfer)     Exercise  Total Joint Exercises Ankle Circles/Pumps: AROM;Both;10 reps;Supine Quad Sets: AAROM;Both;10 reps;Supine Heel Slides: AAROM;Both;10 reps;Supine End of Session PT - End of Session Equipment Utilized During Treatment: Gait belt;Left ankle foot orthosis (PRAFO) Activity Tolerance: Patient limited by fatigue;Patient limited by pain;Treatment limited secondary to medical complications (Comment) (run of VT and PVCs during SPT) Patient left: in chair;with call bell in reach Nurse Communication: Mobility status for transfers General Behavior During Session: [facility] for tasks performed Cognition: Impaired, at baseline  [facility] HELEN 11/12/2011, 12:32 PM

## 2011-11-12 NOTE — Progress Notes (Addendum)
CSW met with pt to address discharge plan. Pt has a bed offer from [facility]. Pt is agreeable to returning to SNF when medically ready. CSW left a message with Marta Lamas, Admission Coordinator at [facility] regarding return to SNF when medically ready. CSW will contact pt's son, Audree Bane, regarding discharge plan as well. CSW will continue to follow.   Dede Query, MSW, Theresia Majors [phone]  Correction: Pt has a bed offer from [facility]. Pt will be returning to [facility], the facility where she is a resident.   Dede Query, MSW, Theresia Majors [phone]

## 2011-11-12 NOTE — Progress Notes (Signed)
Pt had a 4 beat run of VTach. Pt asymptomatic. No complaints of dizzyness or SOB. PA in room with patient. PA notified. No new orders at this time. Will continue to monitor. Dion Saucier

## 2011-11-12 NOTE — Progress Notes (Signed)
Patient's blood pressure was 204/100. Patient was given prn IV hydralizine 10mg  per MD order. Follow up blood pressure was 157/70. Will continue to monitor.

## 2011-11-13 DIAGNOSIS — M7989 Other specified soft tissue disorders: Secondary | ICD-10-CM

## 2011-11-13 DIAGNOSIS — M79609 Pain in unspecified limb: Secondary | ICD-10-CM

## 2011-11-13 LAB — BASIC METABOLIC PANEL
BUN: 18 mg/dL (ref 6–23)
Chloride: 104 mEq/L (ref 96–112)
GFR calc Af Amer: 88 mL/min — ABNORMAL LOW (ref >90)
GFR calc non Af Amer: 76 mL/min — ABNORMAL LOW (ref >90)
Potassium: 3.6 mEq/L (ref 3.5–5.1)
Sodium: 138 mEq/L (ref 135–145)

## 2011-11-13 LAB — CBC
HCT: 34.2 % — ABNORMAL LOW (ref 36.0–46.0)
MCHC: 32.2 g/dL (ref 30.0–36.0)
RDW: 16.8 % — ABNORMAL HIGH (ref 11.5–15.5)
WBC: 7.1 10*3/uL (ref 4.0–10.5)

## 2011-11-13 NOTE — Discharge Summary (Signed)
[facility] Discharge Summary  Lisa Nichols Feb 10, 1924 78 y.o. female  [id number]  Admission Date: 11/01/2011  Discharge Date: 11/13/11  Physician: Juleen China, MD  Admission Diagnosis: Abdominal aortic aneurysm Thrombis compartment syndrome   HPI:   This is a 77 y.o. female that I had seen for evaluation of an abdominal aortic aneurysm. The patient was recently hospitalized in [place] for shortness of breath and hypoxia. The most likely diagnosis was heart failure. She is now on 2 L of oxygen. During her hospitalization she was found to have an abdominal aortic aneurysm measuring 6.5 cm in maximum diameter she initially refused to be seen by vascular surgeons because she did not want this repaired however has subsequently changed her mind. She denies having any abdominal pain or back pain at this time.  The patient has a history of type 2 diabetes which is medically managed. She is known carotid artery stenosis. The most recent ultrasound I confined shows 40-59% stenosis. She has COPD and is on 2 L of home oxygen, continuously. She is also a greater than 50-pack-year history of smoking.  Hospital Course:  The patient was admitted to the hospital and taken to the operating room on 11/01/2011 and underwent EVAR and tolerated well.  In the PACU, she did not have a doppler signal in her pedal pulses.  She was taken back to the OR  and underwent  Left CFA exposure  Left CFA endarterectomy with patch angioplasty As she was found to have Occlusion of the femoral artery at the site of the closure device. Following the procedure the patient had a palpable DP pulse.   The pt tolerated the procedure well and was transported to the PACU in good condition.  By the next morning, the pt did have a foot drop.  When Dr. Edilia Bo examined her, she had moderate swelling in the left calf.  At that time, he felt like she had developed an early compartment syndrome.  She was then taken back  to the OR and underwent emergent 4 compartment fasciotomy.  She tolerated well and was transferred to the PACU in stable condition.  She did have acute surgical blood loss anemia and was transfused PRBCs.  Her wounds are POD 1 looked clean and well perfused.  On 11/06/11, the pt's fasciotomy was closed at bedside and she tolerated well.  Her foley was removed, but had to be reinserted due to being unable to void.  By POD 5, she was getting some plantar and dorsi flexion back of her left ankle.  Pt also had lack of appetite and had to be encouraged to eat.  PT/OT also were ordered and worked with the pt.  Her wounds have continued to look good and healing post operatively.  An u/s was ordered on POD 10 to r/u DVT and this was negative. The remainder of the hospital course consisted of increasing ambulation and increasing intake of solids without difficulty.    Basename 11/13/11 0500 11/12/11 0455  NA 138 139  K 3.6 3.2*  CL 104 105  CO2 25 27  GLUCOSE 75 86  BUN 18 14  CALCIUM 8.4 8.3*    Basename 11/13/11 0500 11/12/11 0455  WBC 7.1 7.8  HGB 11.0* 11.6*  HCT 34.2* 35.9*  PLT 236 224   No results found for this basename: INR:2 in the last 72 hours   Discharge Instructions:   The patient is discharged to home with extensive instructions on wound care and progressive  ambulation.  They are instructed not to drive or perform any heavy lifting until returning to see the physician in his office.  Discharge Orders    Future Appointments: Provider: Department: Dept Phone: Center:   12/03/2011 2:00 PM V Durene Cal, MD [facility] [phone] [facility]     Future Orders Please Complete By Expires   Resume previous diet      Call MD for:  temperature >100.5      Call MD for:  redness, tenderness, or signs of infection (pain, swelling, bleeding, redness, odor or green/yellow discharge around incision site)      Call MD for:  severe or increased pain, loss or decreased feeling  in affected  limb(s)      Increase activity slowly      Comments:   Walk with assistance use walker or cane as needed   May shower       Scheduling Instructions:   monday   Remove dressing in 24 hours      ABDOMINAL PROCEDURE/ANEURYSM REPAIR/AORTO-BIFEMORAL BYPASS:  Call MD for increased abdominal pain; cramping diarrhea; nausea/vomiting      Resume previous diet      Call MD for:  temperature >100.5      Call MD for:  redness, tenderness, or signs of infection (pain, swelling, bleeding, redness, odor or green/yellow discharge around incision site)      Call MD for:  severe or increased pain, loss or decreased feeling  in affected limb(s)      may wash over wound with mild soap and water      Scheduling Instructions:   Shower daily with soap and water starting 11/14/11    Discharge instructions      Comments:   Continue physical therapy and occupational therapy.  Continue to encourage po intake.  Change dressing to LLE qday.  Pt to wear boot on LLE      Discharge Diagnosis:  Abdominal aortic aneurysm Thrombis compartment syndrome  Secondary Diagnosis: Patient Active Problem List  Diagnoses  . Abdominal aneurysm without mention of rupture   Past Medical History  Diagnosis Date  . CAD (coronary artery disease)   . Anemia   . AAA (abdominal aortic aneurysm)   . History of syncope   . Compression fracture of thoracic vertebra 2008    T7- T8  . Cholelithiases   . Carotid artery occlusion   . Diverticulosis of colon   . Hiatal hernia   . CHF (congestive heart failure)   . Kyphosis   . Blood transfusion   . Urinary tract infection     hx of  . Diabetes mellitus   . Bronchitis     hx of  . Hypertension     sees Dr. Iran Ouch, primary doctor   . COPD (chronic obstructive pulmonary disease)     on O2 at 2 L [place] continuous sees Dr. Sherril Croon  . Shortness of breath        Lisa Nichols, Lisa Nichols  Home Medication Instructions UJW:[id number]   Printed on:11/13/11 0818  Medication Information                     metoprolol tartrate (LOPRESSOR) 25 MG tablet Take 25 mg by mouth 3 (three) times daily.            nitroGLYCERIN (NITROSTAT) 0.4 MG SL tablet Place 0.4 mg under the tongue every 5 (five) minutes as needed. For chest pain           albuterol (  ACCUNEB) 1.25 MG/3ML nebulizer solution Take 1 ampule by nebulization 4 (four) times daily.            Respiratory Therapy Supplies (PULMO-AIDE COMP/PULMO-NEB DISP) DEVI by Does not apply route.             aspirin 81 MG chewable tablet Chew 81 mg by mouth daily.             Disposition: SNF.  Will need to continue PT/OT at facility  Patient's condition: is Limited  Follow up: 1. Dr. Myra Gianotti in one week and again on 12/03/11.  She will have a CTA before returning for 2nd appt.   Newton Pigg, PA-C [facility] [phone] 11/13/2011  8:18 AM

## 2011-11-13 NOTE — Discharge Summary (Signed)
Agree with above  Lisa Nichols 

## 2011-11-13 NOTE — Progress Notes (Signed)
Pt discharge instructions sent with patient to [facility]. IV d/c. Site WNL. No s/s of distress. Pt had no further questions. Pts family aware of discharge. Discharge via EMS to [facility]. Lisa Nichols

## 2011-11-13 NOTE — Progress Notes (Signed)
CSW spoke with pt's son, Lisa Nichols, regarding discharge plan. Pt's son is agreeable to returning to [facility]. CSW spoke to Admission Coordinator at [facility], and they are able accept pt when ready. CSW will contact MD re discharge. CSW will continue to follow to facilitate discharge to [facility]   Dede Query, MSW, [place]ia Majors [phone]

## 2011-11-13 NOTE — Progress Notes (Addendum)
[facility] Progress Note  11/13/2011 8:09 AM POD 10/9  Subjective:  Wants to eat this am   Filed Vitals:   11/13/11 0551  BP: 177/80  Pulse: 98  Temp: 98.4 F (36.9 C)  Resp: 20    Physical Exam:  Extremities:  LLE with boot in place.  Left foot/ankle with edema.  Warm. Has some dorsi flexion and moves toes more today.  CBC    Component Value Date/Time   WBC 7.1 11/13/2011 0500   RBC 3.91 11/13/2011 0500   HGB 11.0* 11/13/2011 0500   HCT 34.2* 11/13/2011 0500   PLT 236 11/13/2011 0500   MCV 87.5 11/13/2011 0500   MCH 28.1 11/13/2011 0500   MCHC 32.2 11/13/2011 0500   RDW 16.8* 11/13/2011 0500   LYMPHSABS 2.0 10/25/2011 1501   MONOABS 0.9 10/25/2011 1501   EOSABS 0.3 10/25/2011 1501   BASOSABS 0.0 10/25/2011 1501    BMET    Component Value Date/Time   NA 138 11/13/2011 0500   K 3.6 11/13/2011 0500   CL 104 11/13/2011 0500   CO2 25 11/13/2011 0500   GLUCOSE 75 11/13/2011 0500   BUN 18 11/13/2011 0500   CREATININE 0.72 11/13/2011 0500   CALCIUM 8.4 11/13/2011 0500   GFRNONAA 76* 11/13/2011 0500   GFRAA 88* 11/13/2011 0500    INR    Component Value Date/Time   INR 1.63* 11/02/2011 1330     Intake/Output Summary (Last 24 hours) at 11/13/11 0809 Last data filed at 11/13/11 0555  Gross per 24 hour  Intake      0 ml  Output   1200 ml  Net  -1200 ml     Assessment/Plan:  78 y.o. female is s/p EVAR/ left CFA patch angioplasty  FASCIOTOMY left leg  POD 10/9 -bed available at SNF.  Will d/w Dr. Myra Gianotti continue to encourage food intake -mobilize  Newton Pigg, [place] [facility] [phone] 11/13/2011 8:09 AM          Needs to got rehab Will check u/s to r/o dvt  WElls Hoy Fallert

## 2011-11-13 NOTE — Progress Notes (Signed)
Dressing to Left lower leg changed per MD order. Dressing clean dry and intact. Pt tolerated well. Will continue to monitor. Lisa Nichols

## 2011-11-13 NOTE — Progress Notes (Signed)
VASCULAR LAB PRELIMINARY  PRELIMINARY  PRELIMINARY  PRELIMINARY  Bilateral lower extremity venous duplex completed.    Preliminary report:  Bilateral:  No evidence of DVT, superficial thrombosis, or Baker's Cyst.   Janey Petron D, RVS 11/13/2011, 2:08 PM

## 2011-11-13 NOTE — Progress Notes (Signed)
Utilization review completed. Shamon Lobo, RN, BSN. 11/13/11  

## 2011-11-13 NOTE — Progress Notes (Signed)
Pt is ready for discharge today to [facility] Facility has received discharge summary and is ready for pt to return. Pt and pt's son are agreeable to discharge plan. [facility] will provide transportation. CSW is signing off as no further clinical social work needs identified.   Dede Query, MSW, [place] Majors [phone]

## 2011-11-16 ENCOUNTER — Encounter: Payer: Self-pay | Admitting: Surgery

## 2011-11-19 ENCOUNTER — Encounter: Payer: Self-pay | Admitting: Surgery

## 2011-11-19 ENCOUNTER — Ambulatory Visit (INDEPENDENT_AMBULATORY_CARE_PROVIDER_SITE_OTHER): Payer: Medicare Other | Admitting: Surgery

## 2011-11-19 DIAGNOSIS — I714 Abdominal aortic aneurysm, without rupture: Secondary | ICD-10-CM

## 2011-11-19 NOTE — Progress Notes (Signed)
The patient is here today for followup. She underwent percutaneous endovascular aneurysm repair. This was complicated by acute left leg ischemia which required a return trip to the operating room. Intraoperative findings reveal that the closure device had occluded her femoral artery. This was repaired and the patient regained a palpable pedal pulse on the left. The patient was seen the following morning after frequent compartment syndrome checked and found to have a foot drop. Her calf remained soft however she was taken to the operating room for fasciotomy. There was mild swelling noted within the muscle. The wounds were left open with delayed primary closure sutures placed. These 1 to ultimately close. The patient did develop some improvement in her foot drop. I've also been observing her third and fourth toe which had been mild.  Today the patient continues to have a palpable left and right pedal pulses. Her fasciotomy incisions are completely closed. There is no evidence of infection. She still has a foot drop however she can dorsiflex her foot a small amount. Her left groin and right groin incisions are clean.  The patient sutures will be removed today. She's going to keep her scheduled appointment in 2 weeks with a followup in a CT and TRAM.  I am recommending that she get a foot drop splint placed so that she can resume her ambulatory status.

## 2011-11-26 MED ORDER — DEXTROSE 5 % IV SOLN
1.5000 g | INTRAVENOUS | Status: DC | PRN
  Administered 2011-11-02: 1.5 g via INTRAVENOUS

## 2011-11-26 MED ORDER — DEXTROSE 5 % IV SOLN
1.5000 g | INTRAVENOUS | Status: DC | PRN
  Administered 2011-11-03: 1.5 g via INTRAVENOUS

## 2011-11-26 NOTE — Addendum Note (Signed)
Addendum  created 11/26/11 1438 by Elon Alas, CRNA   Modules edited:Anesthesia Medication Administration

## 2011-11-26 NOTE — Addendum Note (Signed)
Addendum  created 11/26/11 1235 by Adair Laundry, CRNA   Modules edited:Anesthesia Medication Administration

## 2011-11-26 NOTE — Addendum Note (Signed)
Addendum  created 11/26/11 1437 by Elon Alas, CRNA   Modules edited:Anesthesia Medication Administration

## 2011-11-30 ENCOUNTER — Encounter: Payer: Self-pay | Admitting: Surgery

## 2011-12-03 ENCOUNTER — Other Ambulatory Visit: Payer: Medicare Other

## 2011-12-03 ENCOUNTER — Ambulatory Visit: Payer: Medicare Other | Admitting: Surgery

## 2012-01-04 ENCOUNTER — Encounter: Payer: Self-pay | Admitting: Surgery

## 2012-01-07 ENCOUNTER — Other Ambulatory Visit: Payer: Medicare Other

## 2012-01-07 ENCOUNTER — Ambulatory Visit: Payer: Medicare Other | Admitting: Surgery

## 2012-01-10 ENCOUNTER — Encounter: Payer: Self-pay | Admitting: Surgery

## 2012-01-14 ENCOUNTER — Encounter: Payer: Self-pay | Admitting: Surgery

## 2012-01-14 ENCOUNTER — Ambulatory Visit (INDEPENDENT_AMBULATORY_CARE_PROVIDER_SITE_OTHER): Payer: Medicare Other | Admitting: Surgery

## 2012-01-14 ENCOUNTER — Ambulatory Visit
Admission: RE | Admit: 2012-01-14 | Discharge: 2012-01-14 | Disposition: A | Discharge status: Home / Self Care | Payer: Medicare Other | Source: Ambulatory Visit | Attending: Surgery | Admitting: Surgery

## 2012-01-14 VITALS — BP 158/77 | HR 79 | Resp 16 | Ht <= 58 in | Wt 84.0 lb

## 2012-01-14 DIAGNOSIS — I714 Abdominal aortic aneurysm, without rupture, unspecified: Secondary | ICD-10-CM

## 2012-01-14 DIAGNOSIS — Z9889 Other specified postprocedural states: Secondary | ICD-10-CM

## 2012-01-14 MED ORDER — IOHEXOL 350 MG/ML SOLN
80.0000 mL | Freq: Once | INTRAVENOUS | Status: AC | PRN
  Administered 2012-01-14: 80 mL via INTRAVENOUS

## 2012-01-14 NOTE — Progress Notes (Signed)
The patient is back today for followup. She is status post percutaneous endovascular aneurysm repair on 11/01/2011. This was complicated by acute left leg ischemia which required return trip to the operating room. She underwent repair of her femoral artery. She later developed a compartment syndrome which required fasciotomy. She is back today for followup. She is doing significantly better.  Her inguinal incisions are all healed. She has no dominant pain. Her left foot drop is significantly improved. She does have 2 small ulcers on the tip of her left fourth and fifth toe this was likely secondary to embolic phenomenon. She has a palpable dorsalis pedis pulse on her foot.  The patient underwent a CT angiogram today to evaluate her repair. By my are poor the aneurysm has decreased to 5.7 from 6.2.  I did not see any evidence of endoleak.  I've recommended that the patient placed triple antibiotic ointment on the areas on her left fourth and fifth toe, to be changed daily. I have also recommended adding Neurontin to assist with some of the neuropathic pain she is having. She will come by to see me in 3 months with the ultrasound to evaluate her aortic repair

## 2012-01-15 NOTE — Progress Notes (Signed)
Addended by: Sharee Pimple on: 01/15/2012 08:57 AM   Modules accepted: Orders

## 2012-01-24 ENCOUNTER — Telehealth: Payer: Self-pay | Admitting: *Deleted

## 2012-01-24 NOTE — Telephone Encounter (Signed)
I spoke to Amy, the wound care nurse at [facility], re: phone call we received from a nurse Marchelle Folks) saying Mrs. Bolding L 4th & 5th toes were worse. [facility] saw her on 01-14-12 and said pt had 2 small ulcers on the toe tips. According to the wound nurse, these toes are the same as when we saw her last; No fever, No drainage, no increase in size. She is doing dry dressings as directed by [facility]. I told her to call if anything changed and she voiced understanding.

## 2012-04-05 IMAGING — CR DG CHEST 1V PORT
1 series · 1 of 1 positions shown · non-contrast
Comparison: 10/25/2011

CLINICAL DATA: Evaluate line placement

PORTABLE CHEST - 1 VIEW

[AP]
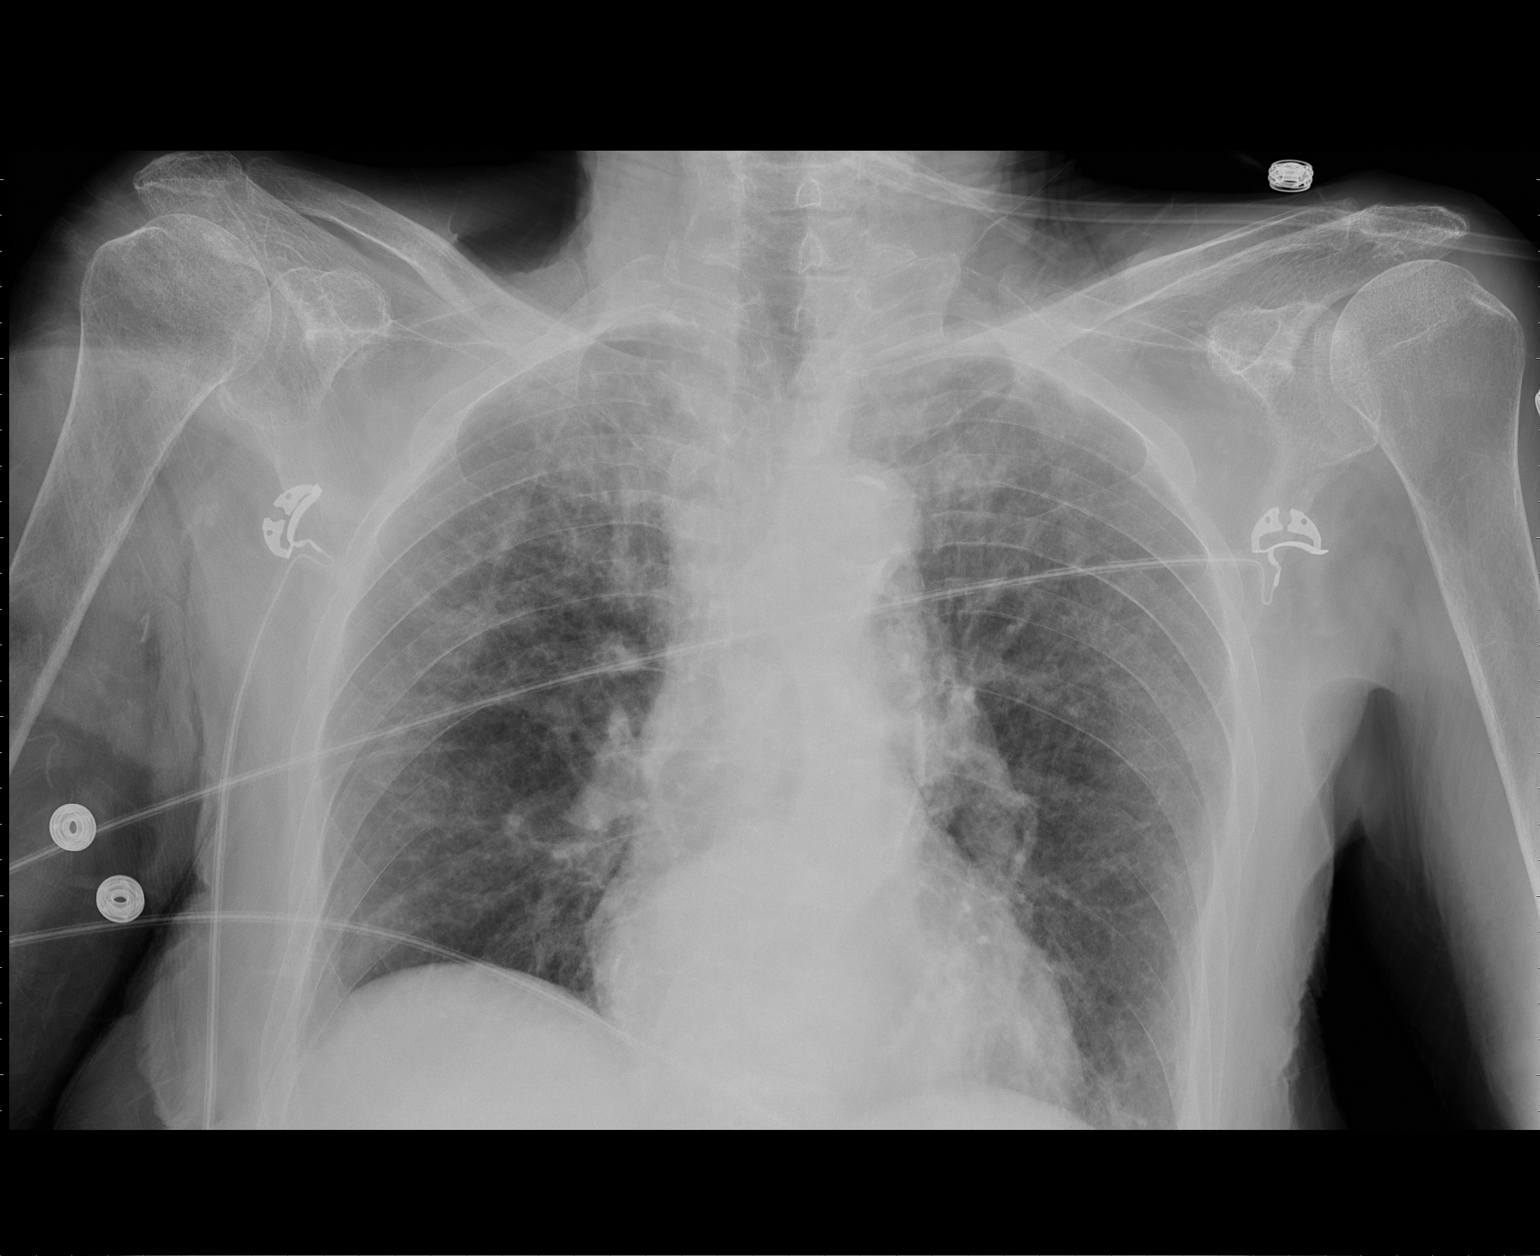

[1 of 1 positions shown; findings below may reference images not displayed]

FINDINGS: Heart size is normal.

Calcified and tortuous unfolded thoracic aorta noted.

No pleural effusions or pulmonary edema.

Coarsened interstitial markings are identified throughout both
lungs, similar to previous exam.

No catheters are visualized.
IMPRESSION: 1.  No catheters are visualized.
2.  Chronic interstitial changes of COPD.

## 2012-04-05 IMAGING — CR DG ABD PORTABLE 1V
1 series · 1 of 1 positions shown · non-contrast
Comparison: 09/13/2011

CLINICAL DATA: Status post stent graft placement

PORTABLE ABDOMEN - 1 VIEW
TECHNIQUE: One view of the abdomen

[AP]
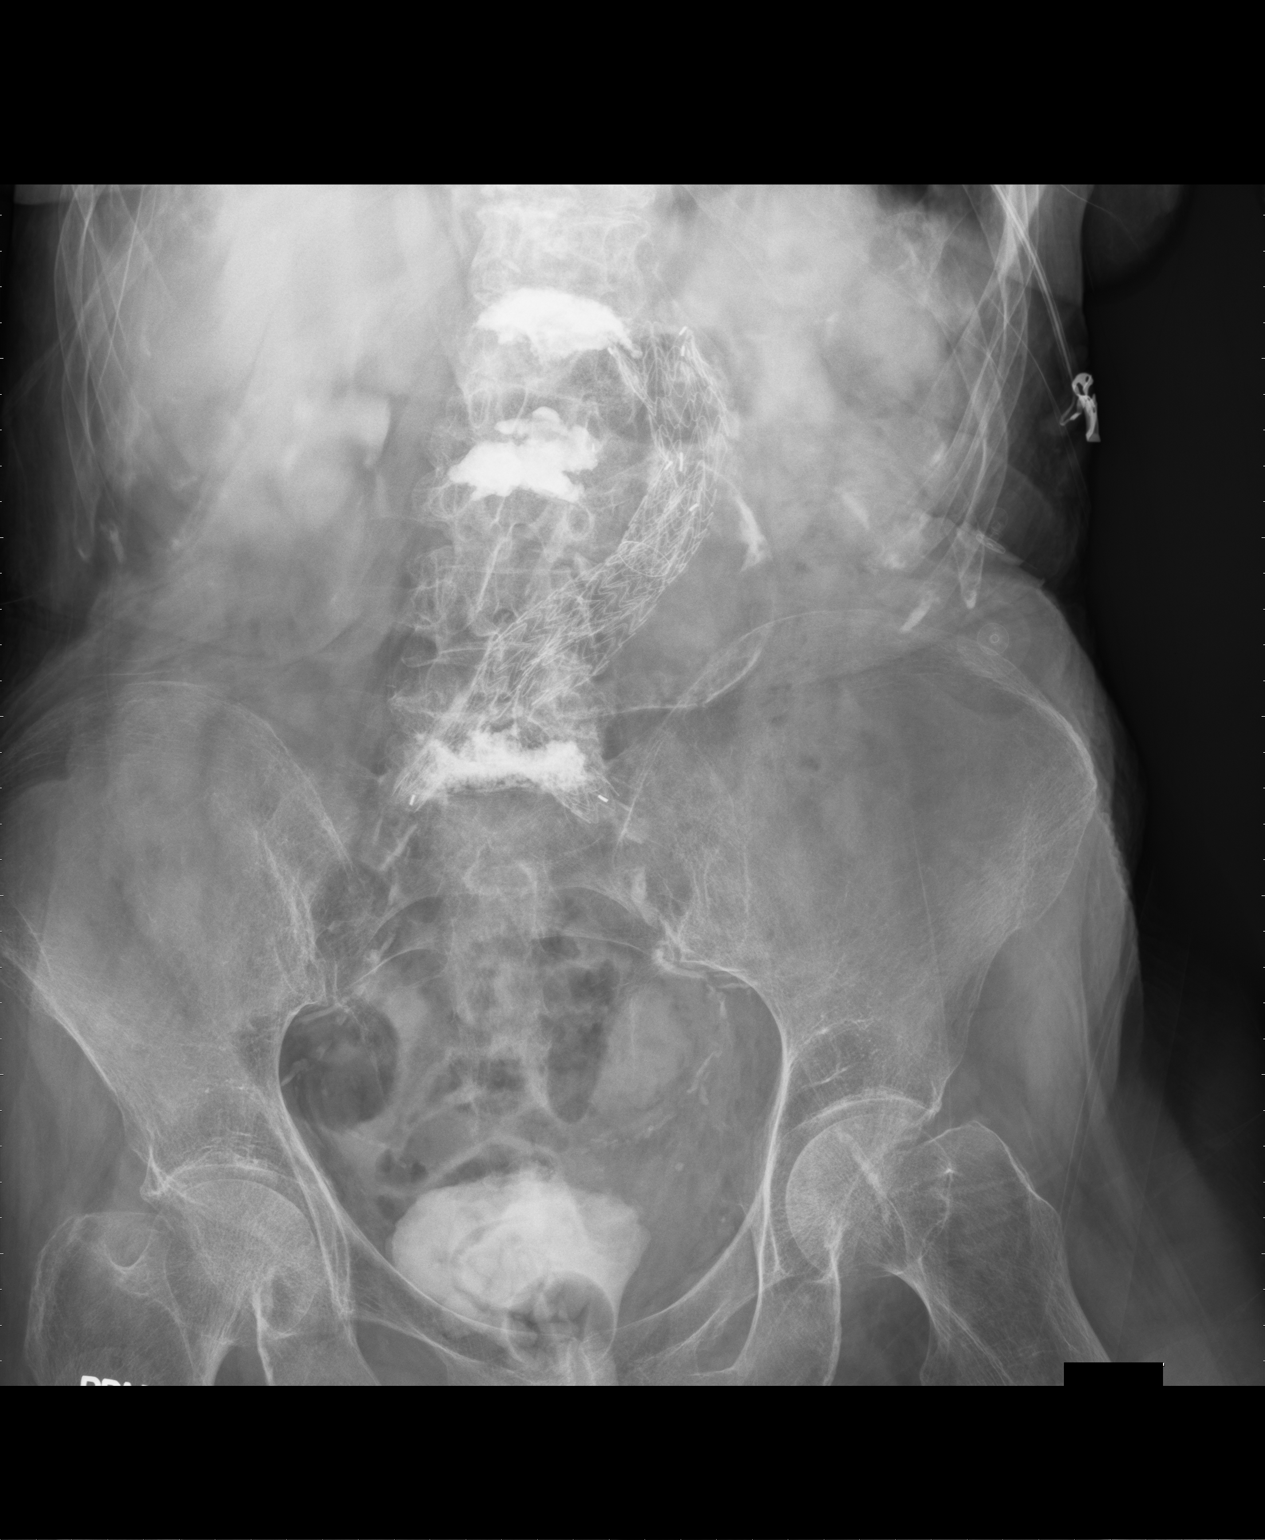

[1 of 1 positions shown; findings below may reference images not displayed]

FINDINGS: The metallic scaffolding from an aortic stent graft is
identified in the expected location of the abdominal aorta.  The
distal limbs of the stent graft are in the expected location of the
common iliac arteries.

Peripherally calcified aneurysm sac is identified surrounding the
stent graft.

There is contrast material identified within the collecting system
of both kidneys and the urinary bladder.
IMPRESSION: Status post stent graft repair of abdominal aortic aneurysm.

## 2012-04-18 ENCOUNTER — Encounter: Payer: Self-pay | Admitting: Surgery

## 2012-04-21 ENCOUNTER — Ambulatory Visit (INDEPENDENT_AMBULATORY_CARE_PROVIDER_SITE_OTHER): Payer: Medicare Other | Admitting: Vascular Surgery

## 2012-04-21 ENCOUNTER — Ambulatory Visit (INDEPENDENT_AMBULATORY_CARE_PROVIDER_SITE_OTHER): Payer: Medicare Other | Admitting: Surgery

## 2012-04-21 ENCOUNTER — Encounter: Payer: Self-pay | Admitting: Surgery

## 2012-04-21 VITALS — BP 181/71 | HR 57 | Temp 97.6°F | Ht <= 58 in | Wt 84.0 lb

## 2012-04-21 DIAGNOSIS — Z48812 Encounter for surgical aftercare following surgery on the circulatory system: Secondary | ICD-10-CM

## 2012-04-21 DIAGNOSIS — I714 Abdominal aortic aneurysm, without rupture: Secondary | ICD-10-CM

## 2012-04-21 NOTE — Progress Notes (Signed)
AAA EVAR duplex performed @ [facility] 04/21/2012

## 2012-04-21 NOTE — Progress Notes (Signed)
[facility] [place]   Patient name: Lisa Nichols MRN: [id number] DOB: 1924-07-17 Sex: female     Chief Complaint  Patient presents with  . AAA    3 month f/u     HISTORY OF PRESENT ILLNESS: The patient is back today for followup. She is status post percutaneous endovascular aneurysm repair on 11/01/2011. This was complicated by an acute left leg ischemia which required a return trip to the operating room to repair her femoral artery. She later developed a compartment syndrome which required fasciotomies. She has been undergoing physical therapy to help with her foot drop. It also following ischemic changes to her fourth and fifth toe.  Past Medical History  Diagnosis Date  . CAD (coronary artery disease)   . Anemia   . AAA (abdominal aortic aneurysm)   . History of syncope   . Compression fracture of thoracic vertebra 2008    T7- T8  . Cholelithiases   . Carotid artery occlusion   . Diverticulosis of colon   . Hiatal hernia   . CHF (congestive heart failure)   . Kyphosis   . Blood transfusion   . Urinary tract infection     hx of  . Diabetes mellitus   . Bronchitis     hx of  . Hypertension     sees Dr. Iran Ouch, primary doctor   . COPD (chronic obstructive pulmonary disease)     on O2 at 2 L [place] continuous sees Dr. Sherril Croon  . Shortness of breath     Past Surgical History  Procedure Date  . Spine surgery     kyphoplasty  . Cardiac catheterization 2009  . Abdominal hysterectomy   . Bladder suspension   . Dilation and curettage of uterus   . Fasciotomy 11/03/2011    Procedure: FASCIOTOMY;  Surgeon: [name], MD;  Location: [facility] OR;  Service: Vascular;  Laterality: Left;  left leg    History   Social History  . Marital Status: Widowed    Spouse Name: N/A    Number of Children: N/A  . Years of Education: N/A   Occupational History  . Not on file.   Social History Main Topics  . Smoking status: Former Smoker -- 50 years    Quit  date: 10/15/2007  . Smokeless tobacco: Never Used  . Alcohol Use: No  . Drug Use: No  . Sexually Active: No   Other Topics Concern  . Not on file   Social History Narrative  . No narrative on file    Family History  Problem Relation Age of Onset  . Heart disease Sister   . Heart disease Brother     Allergies as of 04/21/2012 - Review Complete 04/21/2012  Allergen Reaction Noted  . Barbiturates Other (See Comments) 09/14/2011  . Phenobarbital Other (See Comments) 09/14/2011    Current Outpatient Prescriptions on File Prior to Visit  Medication Sig Dispense Refill  . aspirin 81 MG chewable tablet Chew 81 mg by mouth daily.      Marland Kitchen gabapentin (NEURONTIN) 300 MG capsule Take 300 mg by mouth daily.      . metoprolol tartrate (LOPRESSOR) 25 MG tablet Take 25 mg by mouth 3 (three) times daily.       . mirtazapine (REMERON) 15 MG tablet Take 15 mg by mouth at bedtime.      . nitroGLYCERIN (NITROSTAT) 0.4 MG SL tablet Place 0.4 mg under the tongue every 5 (five) minutes as needed.  For chest pain      . olopatadine (PATANOL) 0.1 % ophthalmic solution Place 1 drop into both eyes daily.      Marland Kitchen PROPYLENE GLYCOL OP Apply 0.4 % to eye 4 (four) times daily.      Marland Kitchen albuterol (ACCUNEB) 1.25 MG/3ML nebulizer solution Take 1 ampule by nebulization 4 (four) times daily.       Marland Kitchen loteprednol (LOTEMAX) 0.5 % ophthalmic suspension Place 1 drop into both eyes 2 (two) times daily.      Marland Kitchen Respiratory Therapy Supplies (PULMO-AIDE COMP/PULMO-NEB DISP) DEVI by Does not apply route.           REVIEW OF SYSTEMS: Cardiovascular: No chest pain, chest pressure, palpitations, orthopnea, or dyspnea on exertion. No claudication or rest pain,  No history of DVT or phlebitis. Pulmonary: No productive cough, asthma or wheezing. Neurologic:  No dizziness. Hematologic: No bleeding problems or clotting disorders. Musculoskeletal: No joint pain or joint swelling. Gastrointestinal: No blood in stool or  hematemesis Genitourinary: No dysuria or hematuria. Psychiatric:: No history of major depression. Integumentary: No rashes or ulcers. Constitutional: No fever or chills.  PHYSICAL EXAMINATION:   Vital signs are BP 181/71  Pulse 57  Temp 97.6 F (36.4 C) (Oral)  Ht 4\' 10"  (1.473 m)  Wt 84 lb (38.102 kg)  BMI 17.56 kg/m2  SpO2 93% General: The patient appears their stated age. HEENT:  No gross abnormalities Pulmonary:  Non labored breathing Abdomen: Soft and non-tender. Aorta is nonpalpable Musculoskeletal: There are no major deformities. Neurologic: No focal weakness or paresthesias are detected, her foot drop has nearly resolved Skin: The ulcer on the fourth and fifth toe have almost completely healed. There is a residual scab on her toe nail on the fifth digit. Psychiatric: The patient has normal affect. Cardiovascular: There is a regular rate and rhythm without significant murmur appreciated. Palpable dorsalis pedis pulse on the left.   Diagnostic Studies Ultrasound was ordered and reviewed today. This shows a decrease in the size of her aneurysm sac. It now measures 5.3 x 5.2. There is no evidence of endoleak.  Assessment: Status post endovascular aneurysm repair Plan: The patient has had a dramatic improvement since I last saw her. The ischemic changes to her fourth and fifth toe have essentially resolved. Her foot drop has dramatically improved. In addition her aneurysm continues to be successfully excluded. I discussed with the patient that she has made a dramatic improvement since I last saw her. She will be kept on our ultrasound surveillance protocol. Her next visit will be in 6 months to see the nurse practitioner  V. Charlena Cross, M.D. [facility] [place] Office: [phone] Pager:  [phone]

## 2012-06-17 IMAGING — CT CT CTA ABD/PEL W/CM AND/OR W/O CM
2 of 8 series · 12 of 36 positions shown, 18 images · IV contrast (80CC OMNI 350)
Comparison: CT angio abdomen pelvis of 09/13/2011

CLINICAL DATA: Endovascular repair of abdominal aortic aneurysm,
follow-up

CT ANGIOGRAPHY ABDOMEN AND PELVIS WITH CONTRAST AND WITHOUT
CONTRAST

[Series 4: angio · axial · 0.64mm/px · z∈[-350,-35]mm · 10 of 214 slices shown, 16 images]
[im 20/214  soft-tissue]
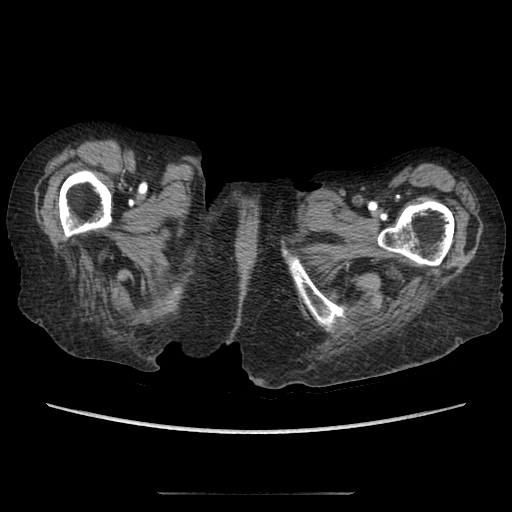
[im 20/214  bone]
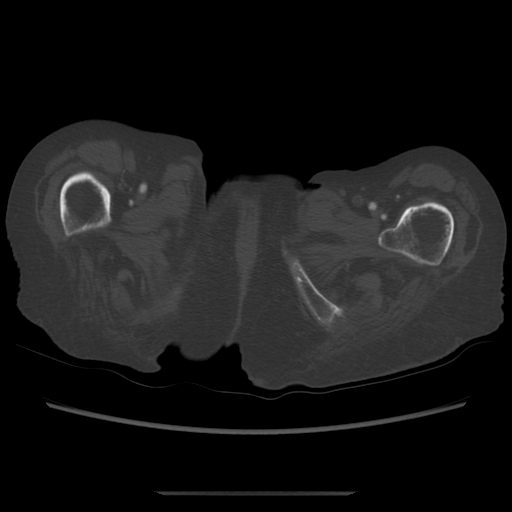
[im 39/214  soft-tissue]
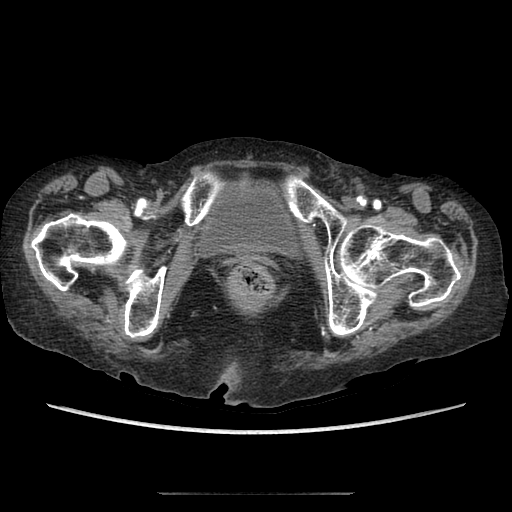
[im 59/214  soft-tissue]
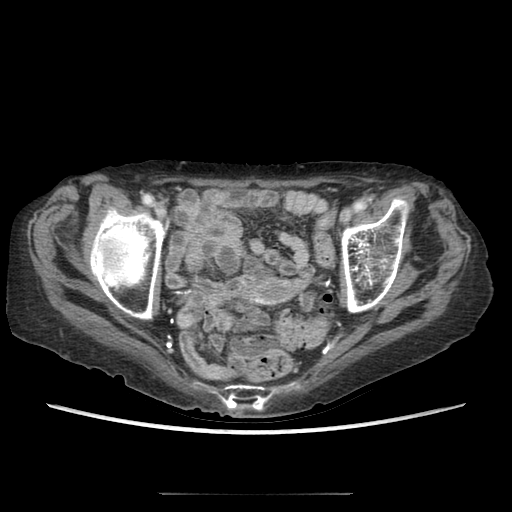
[im 78/214  soft-tissue]
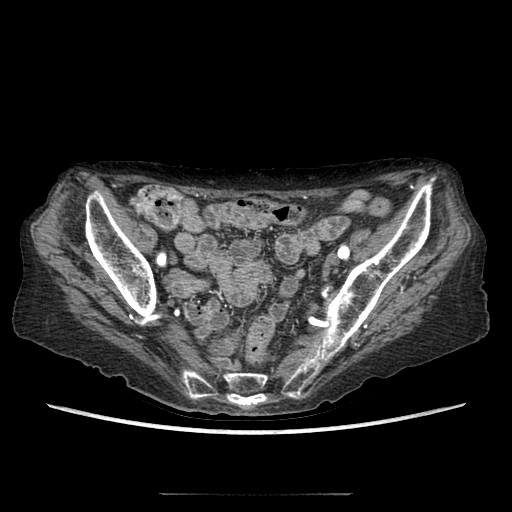
[im 97/214  soft-tissue]
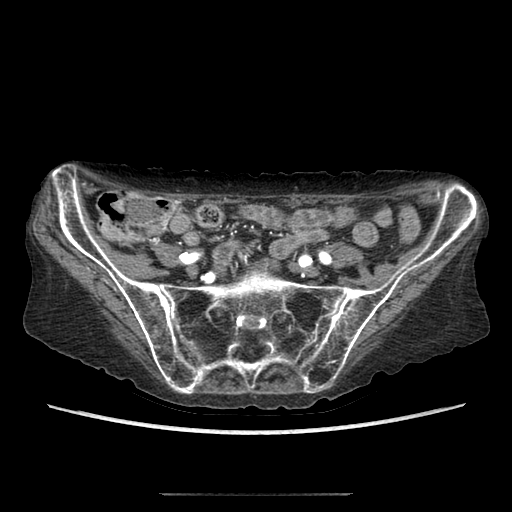
[im 117/214  soft-tissue]
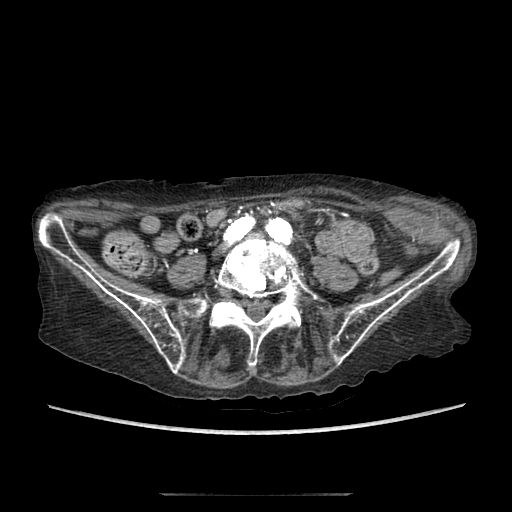
[im 136/214  soft-tissue]
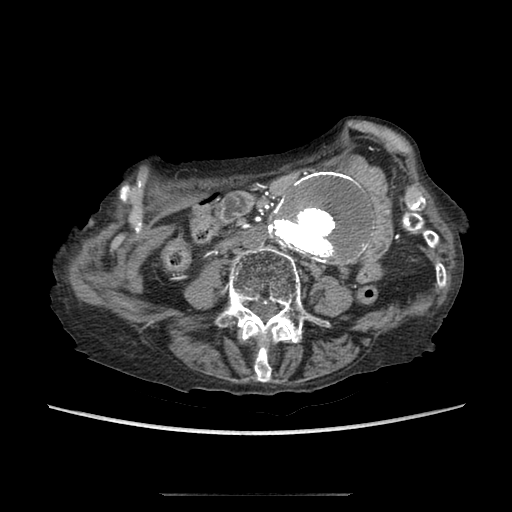
[im 136/214  lung]
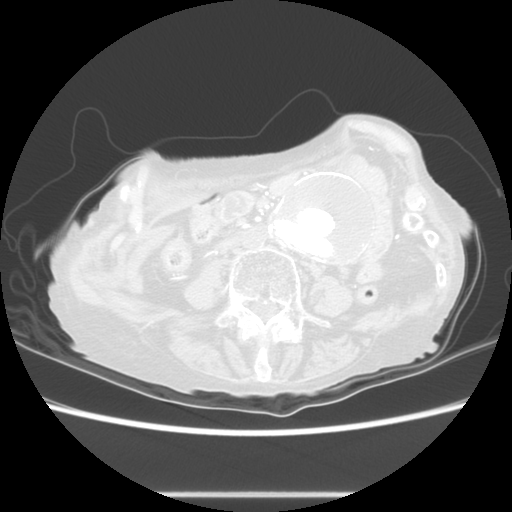
[im 155/214  soft-tissue]
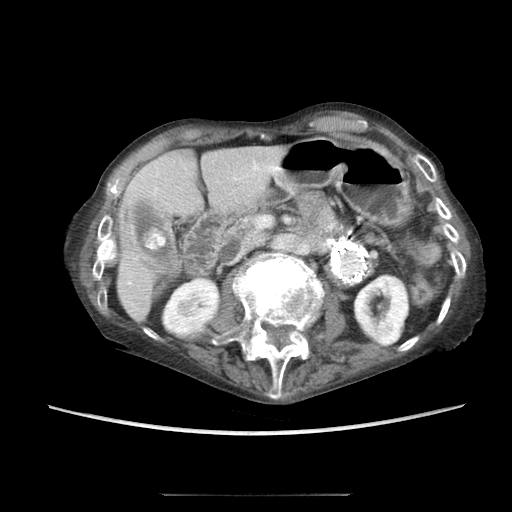
[im 155/214  lung]
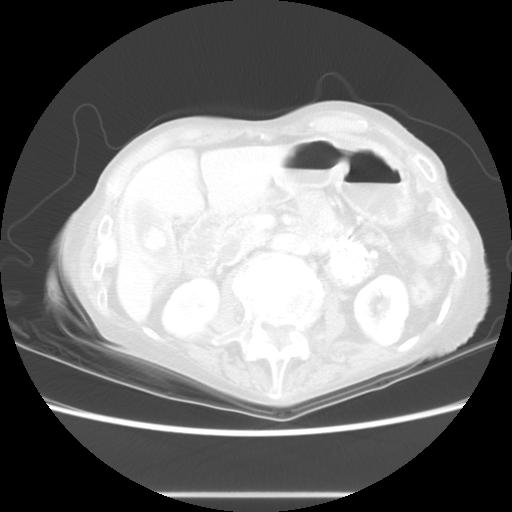
[im 175/214  soft-tissue]
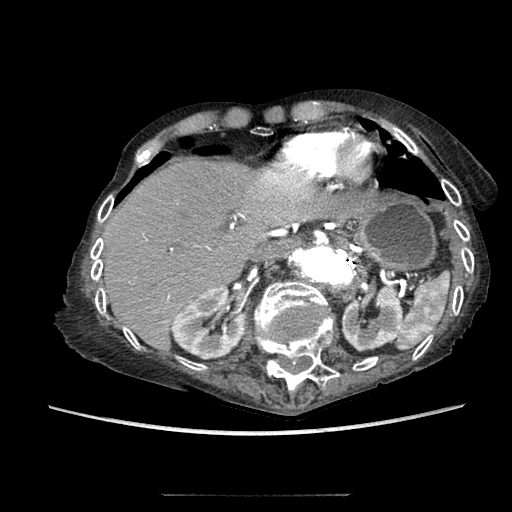
[im 175/214  lung]
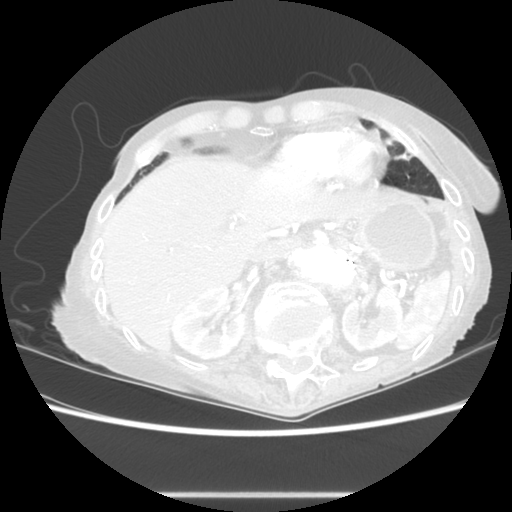
[im 175/214  bone]
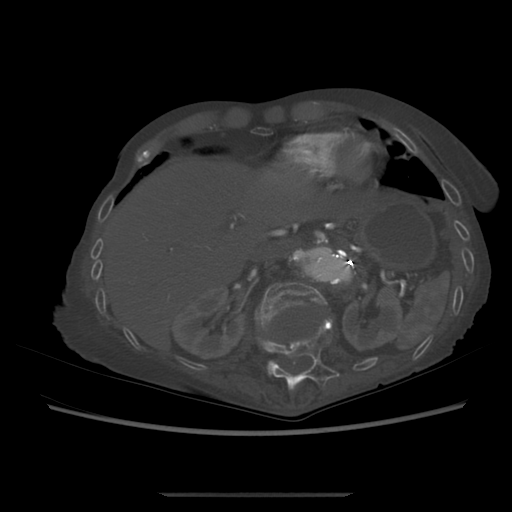
[im 194/214  soft-tissue]
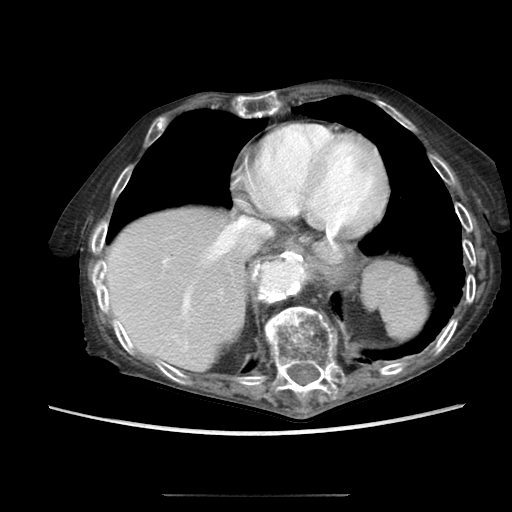
[im 194/214  lung]
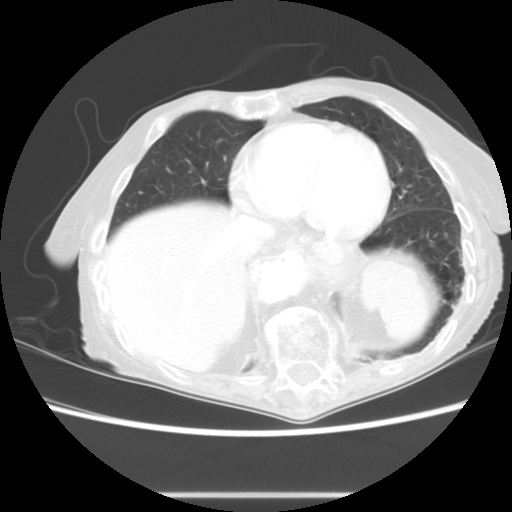

[Series 602: sagittal body · sagittal · 0.79mm/px · 2 of 133 slices shown]
[im 23/133  soft-tissue]
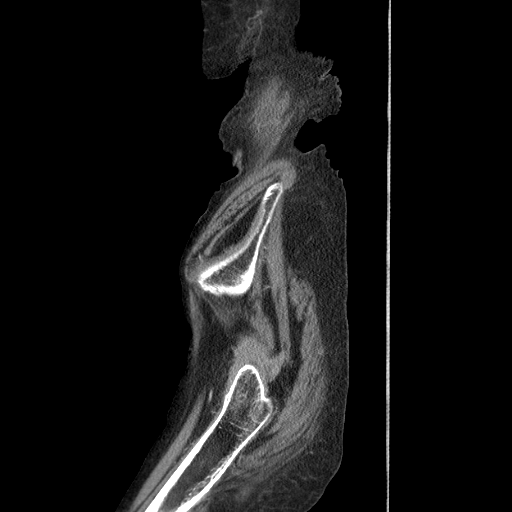
[im 45/133  soft-tissue]
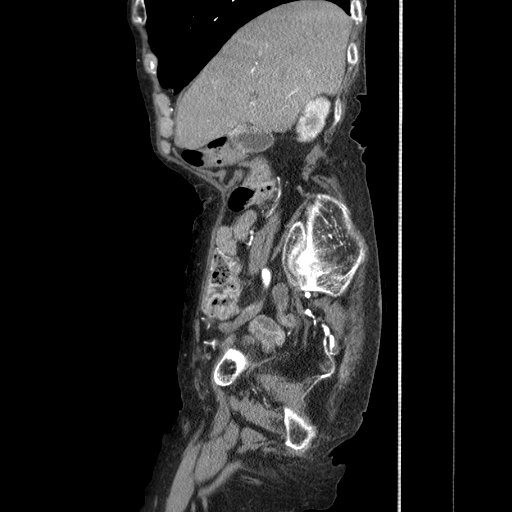

[12 of 36 positions shown; findings below may reference images not displayed]

FINDINGS: The lung bases are clear.  A moderate sized hiatal hernia
is noted.  Considerable atheromatous change of the distal thoracic
aorta is noted.  The AAA stent extends to a point just below the
origins of the renal arteries.  In its largest axial plane the
native aneurysm measures 6.1 x 5.9 cm compared to 6.2 x 6.0 cm
previously.  The lumen of the AAA stent opacifies.  On delayed
images there is no evidence of endovascular leak.

On portal venous phase images the liver enhances with no focal
abnormality.  There is a gallstone within the gallbladder of
approximately 16 mm in diameter.  The gallbladder wall is slightly
thickened and irregular and clinical correlation is recommended.
The common bile duct is noted to be dilated measuring up to 16 mm
in diameter which is relatively stable compared to the prior CT.
Correlation with liver function tests is recommended.  The pancreas
appears somewhat atrophic.  The adrenal glands are unremarkable,
and the spleen is stable.  The stomach is moderately fluid
distended with no abnormality noted.  The kidneys enhance with no
calculus or mass and no hydronephrosis is seen.

The iliac limbs of the AAA stent opacify with no complicating
features.  There is opacification of the external and internal
iliac arteries bilaterally.

The urinary bladder is not well distended. The uterus is somewhat
atrophic. No adnexal lesion is seen.  No fluid is noted within the
pelvis.  There are scattered rectosigmoid colonic diverticula
present.  No abnormality of the colon is seen.  The bones are
diffusely osteopenic and multiple lower thoracic and lumbar
vertebral body compression deformities are noted with
vertebroplasty is noted at L1, L3, and L5 levels.
IMPRESSION: 1.  The AAA stent is in good position with no complicating
features.  No endovascular leak is seen.
2.  Incidental gallstone of 16 mm in diameter.
3.  Prominent common bile duct measuring up to 16 mm.  Recommend
correlation with liver function tests although this appears stable
compared to the prior CT.
4.  Moderate sized hiatal hernia.
5.  Osteopenia and multiple lower thoracic and lumbar vertebral
body compression deformities.

## 2012-10-24 ENCOUNTER — Encounter: Payer: Self-pay | Admitting: Neurosurgery

## 2012-10-27 ENCOUNTER — Encounter: Payer: Self-pay | Admitting: Neurosurgery

## 2012-10-27 ENCOUNTER — Ambulatory Visit (INDEPENDENT_AMBULATORY_CARE_PROVIDER_SITE_OTHER): Payer: Medicare Other | Admitting: Neurosurgery

## 2012-10-27 ENCOUNTER — Encounter (INDEPENDENT_AMBULATORY_CARE_PROVIDER_SITE_OTHER): Payer: Medicare Other | Admitting: *Deleted

## 2012-10-27 ENCOUNTER — Other Ambulatory Visit: Payer: Self-pay | Admitting: *Deleted

## 2012-10-27 VITALS — BP 142/72 | HR 61 | Resp 16 | Ht <= 58 in | Wt 80.0 lb

## 2012-10-27 DIAGNOSIS — Z48812 Encounter for surgical aftercare following surgery on the circulatory system: Secondary | ICD-10-CM

## 2012-10-27 DIAGNOSIS — I714 Abdominal aortic aneurysm, without rupture, unspecified: Secondary | ICD-10-CM

## 2012-10-27 DIAGNOSIS — I739 Peripheral vascular disease, unspecified: Secondary | ICD-10-CM

## 2012-10-27 NOTE — Progress Notes (Signed)
[facility] [place] [facility] Office Note  CC: AAA and left lower extremity surveillance Referring Physician: Brabham  History of Present Illness: 79 year old female patient of Dr. Myra Gianotti status post endovascular aortic. February 2013.. This was complicated by an acute left leg ischemia which required a return trip to the operating room to repair her femoral artery. She later developed a compartment syndrome which required fasciotomies. The patient denies lower extremity pain. Her wounds on her left foot has healed. Patient states she can ambulate with assistance without difficulty. The patient denies any unusual abdominal or back pain.   Past Medical History  Diagnosis Date  . CAD (coronary artery disease)   . Anemia   . AAA (abdominal aortic aneurysm)   . History of syncope   . Compression fracture of thoracic vertebra 2008    T7- T8  . Cholelithiases   . Carotid artery occlusion   . Diverticulosis of colon   . Hiatal hernia   . CHF (congestive heart failure)   . Kyphosis   . Blood transfusion   . Urinary tract infection     hx of  . Diabetes mellitus   . Bronchitis     hx of  . Hypertension     sees Dr. Iran Ouch, primary doctor   . COPD (chronic obstructive pulmonary disease)     on O2 at 2 L [place] continuous sees Dr. Sherril Croon  . Shortness of breath     ROS: [x]  Positive   [ ]  Denies    General: [ ]  Weight loss, [ ]  Fever, [ ]  chills Neurologic: [ ]  Dizziness, [ ]  Blackouts, [ ]  Seizure [ ]  Stroke, [ ]  "Mini stroke", [ ]  Slurred speech, [ ]  Temporary blindness; [ ]  weakness in arms or legs, [ ]  Hoarseness Cardiac: [ ]  Chest pain/pressure, [ ]  Shortness of breath at rest [ ]  Shortness of breath with exertion, [ ]  Atrial fibrillation or irregular heartbeat Vascular: [ ]  Pain in legs with walking, [ ]  Pain in legs at rest, [ ]  Pain in legs at night,  [ ]  Non-healing ulcer, [ ]  Blood clot in vein/DVT,   Pulmonary: [ ]  Home oxygen, [ ]  Productive cough, [ ]   Coughing up blood, [ ]  Asthma,  [ ]  Wheezing Musculoskeletal:  [ ]  Arthritis, [ ]  Low back pain, [ ]  Joint pain Hematologic: [ ]  Easy Bruising, [ ]  Anemia; [ ]  Hepatitis Gastrointestinal: [ ]  Blood in stool, [ ]  Gastroesophageal Reflux/heartburn, [ ]  Trouble swallowing Urinary: [ ]  chronic Kidney disease, [ ]  on HD - [ ]  MWF or [ ]  TTHS, [ ]  Burning with urination, [ ]  Difficulty urinating Skin: [ ]  Rashes, [ ]  Wounds Psychological: [ ]  Anxiety, [ ]  Depression   Social History History  Substance Use Topics  . Smoking status: Former Smoker -- 50 years    Quit date: 10/15/2007  . Smokeless tobacco: Never Used  . Alcohol Use: No    Family History Family History  Problem Relation Age of Onset  . Heart disease Sister   . Heart disease Brother   . Heart attack Son     Allergies  Allergen Reactions  . Barbiturates Other (See Comments)    unknown  . Phenobarbital Other (See Comments)    unknown    Current Outpatient Prescriptions  Medication Sig Dispense Refill  . albuterol (ACCUNEB) 1.25 MG/3ML nebulizer solution Take 1 ampule by nebulization 4 (four) times daily.       Marland Kitchen  aspirin 81 MG chewable tablet Chew 81 mg by mouth daily.      Marland Kitchen gabapentin (NEURONTIN) 300 MG capsule Take 300 mg by mouth daily.      . magnesium hydroxide (MILK OF MAGNESIA) 400 MG/5ML suspension Take 5 mLs by mouth daily as needed.      . metoprolol tartrate (LOPRESSOR) 25 MG tablet Take 25 mg by mouth 3 (three) times daily.       . mirtazapine (REMERON) 15 MG tablet Take 15 mg by mouth at bedtime.      . nitroGLYCERIN (NITROSTAT) 0.4 MG SL tablet Place 0.4 mg under the tongue every 5 (five) minutes as needed. For chest pain      . olopatadine (PATANOL) 0.1 % ophthalmic solution Place 1 drop into both eyes daily.      Marland Kitchen PROPYLENE GLYCOL OP Apply 0.4 % to eye 4 (four) times daily.      Marland Kitchen Respiratory Therapy Supplies (PULMO-AIDE COMP/PULMO-NEB DISP) DEVI by Does not apply route.        . traMADol (ULTRAM) 50  MG tablet Take 50 mg by mouth every 6 (six) hours as needed.      . loteprednol (LOTEMAX) 0.5 % ophthalmic suspension Place 1 drop into both eyes 2 (two) times daily.       No current facility-administered medications for this visit.    Physical Examination  Filed Vitals:   10/27/12 1014  BP: 142/72  Pulse: 61  Resp: 16    Body mass index is 16.72 kg/(m^2).  General:  WDWN in NAD Gait: Normal HEENT: WNL Eyes: Pupils equal Pulmonary: normal non-labored breathing , without Rales, rhonchi,  wheezing Cardiac: RRR, without  Murmurs, rubs or gallops; No carotid bruits Abdomen: soft, NT, no masses Skin: no rashes, ulcers noted Vascular Exam/Pulses: No pulsatile abdominal mass is palpated, she has lower extremity pulses bilaterally  Extremities without ischemic changes, no Gangrene , no cellulitis; no open wounds;  Musculoskeletal: no muscle wasting or atrophy  Neurologic: A&O X 3; Appropriate Affect ; SENSATION: normal; MOTOR FUNCTION:  moving all extremities equally. Speech is fluent/normal  Non-Invasive Vascular Imaging: AAA duplex shows a maximum diameter of 5.2 x 5.1 which is a slight decreased from previous exam in August 2013. ABIs today are 0.96 and triphasic on the right, 0.98 triphasic on the left  ASSESSMENT/PLAN: Asymptomatic patient that per Dr. Myra Gianotti, will followup in one year with repeat AAA duplex and ABIs. The patient's questions were encouraged and answered, she is in agreement with this plan.  Lauree Chandler ANP  Clinic M.D.: Myra Gianotti

## 2013-08-12 ENCOUNTER — Other Ambulatory Visit: Payer: Self-pay | Admitting: Surgery

## 2013-08-12 DIAGNOSIS — I714 Abdominal aortic aneurysm, without rupture: Secondary | ICD-10-CM

## 2013-08-12 DIAGNOSIS — Z48812 Encounter for surgical aftercare following surgery on the circulatory system: Secondary | ICD-10-CM

## 2013-10-20 ENCOUNTER — Encounter: Payer: Self-pay | Admitting: Family

## 2013-10-21 ENCOUNTER — Ambulatory Visit (INDEPENDENT_AMBULATORY_CARE_PROVIDER_SITE_OTHER)
Admission: RE | Admit: 2013-10-21 | Discharge: 2013-10-21 | Disposition: A | Discharge status: Home / Self Care | Payer: Medicare Other | Source: Ambulatory Visit | Attending: Surgery | Admitting: Surgery

## 2013-10-21 ENCOUNTER — Ambulatory Visit (INDEPENDENT_AMBULATORY_CARE_PROVIDER_SITE_OTHER): Payer: Medicare Other | Admitting: Family

## 2013-10-21 ENCOUNTER — Encounter: Payer: Self-pay | Admitting: Family

## 2013-10-21 ENCOUNTER — Ambulatory Visit (HOSPITAL_COMMUNITY)
Admission: RE | Admit: 2013-10-21 | Discharge: 2013-10-21 | Disposition: A | Discharge status: Home / Self Care | Payer: Medicare Other | Source: Ambulatory Visit | Attending: Surgery | Admitting: Surgery

## 2013-10-21 VITALS — BP 181/93 | HR 75 | Resp 16 | Ht <= 58 in | Wt 84.0 lb

## 2013-10-21 DIAGNOSIS — I739 Peripheral vascular disease, unspecified: Secondary | ICD-10-CM

## 2013-10-21 DIAGNOSIS — I714 Abdominal aortic aneurysm, without rupture, unspecified: Secondary | ICD-10-CM

## 2013-10-21 DIAGNOSIS — Z87891 Personal history of nicotine dependence: Secondary | ICD-10-CM | POA: Insufficient documentation

## 2013-10-21 DIAGNOSIS — I10 Essential (primary) hypertension: Secondary | ICD-10-CM | POA: Insufficient documentation

## 2013-10-21 DIAGNOSIS — Z48812 Encounter for surgical aftercare following surgery on the circulatory system: Secondary | ICD-10-CM

## 2013-10-21 NOTE — Progress Notes (Signed)
[facility] [place]  Established EVAR  History of Present Illness  Lisa Nichols is a 80 y.o. (05/15/1924) female patient of Dr. Myra GianottiBrabham who is status post endovascular aortic repair February 2013.. This was complicated by an acute left leg ischemia which required a return trip to the operating room to repair her femoral artery. She later developed a compartment syndrome which required fasciotomies.  She returns today for routine surveillance. Has low back pain for about a week with urinary frequency, caregiver states they plan on getting a U/A spec. To check for UTI.Meredeth Ide.  Walks with assistance daily at the nursing facility, caregiver states that pt used to have foot drop in her left foot, but this has resolved with therapy. Pt denies claudication type symptoms with walking. She denies abdominal pain. She states she has a good appetite. Caregiver present.  Pt Diabetic: No Pt smoker: former smoker, quit in 2009   Past Medical History  Diagnosis Date  . CAD (coronary artery disease)   . Anemia   . AAA (abdominal aortic aneurysm)   . History of syncope   . Compression fracture of thoracic vertebra 2008    T7- T8  . Cholelithiases   . Carotid artery occlusion   . Diverticulosis of colon   . Hiatal hernia   . CHF (congestive heart failure)   . Kyphosis   . Blood transfusion   . Urinary tract infection     hx of  . Diabetes mellitus   . Bronchitis     hx of  . Hypertension     sees Dr. Iran Ouch. Vyas, primary doctor   . COPD (chronic obstructive pulmonary disease)     on O2 at 2 L [place] continuous sees Dr. Sherril CroonVyas  . Shortness of breath    Past Surgical History  Procedure Laterality Date  . Spine surgery      kyphoplasty  . Cardiac catheterization  2009  . Abdominal hysterectomy    . Bladder suspension    . Dilation and curettage of uterus    . Fasciotomy  11/03/2011    Procedure: FASCIOTOMY;  Surgeon: Chuck Hinthristopher S Dickson, MD;  [facility];  Service:  Vascular;  Laterality: Left;  left leg   Social History History  Substance Use Topics  . Smoking status: Former Smoker -- 50 years    Quit date: 10/15/2007  . Smokeless tobacco: Never Used  . Alcohol Use: No   Family History Family History  Problem Relation Age of Onset  . Heart disease Sister   . Heart disease Brother   . Heart attack Son    Current Outpatient Prescriptions on File Prior to Visit  Medication Sig Dispense Refill  . albuterol (ACCUNEB) 1.25 MG/3ML nebulizer solution Take 1 ampule by nebulization 4 (four) times daily.       Marland Kitchen. aspirin 81 MG chewable tablet Chew 81 mg by mouth daily.      Marland Kitchen. gabapentin (NEURONTIN) 300 MG capsule Take 300 mg by mouth daily.      Marland Kitchen. loteprednol (LOTEMAX) 0.5 % ophthalmic suspension Place 1 drop into both eyes 2 (two) times daily.      . magnesium hydroxide (MILK OF MAGNESIA) 400 MG/5ML suspension Take 5 mLs by mouth daily as needed.      . metoprolol tartrate (LOPRESSOR) 25 MG tablet Take 25 mg by mouth 3 (three) times daily.       . mirtazapine (REMERON) 15 MG tablet Take 15 mg by mouth at bedtime.      .Marland Kitchen  nitroGLYCERIN (NITROSTAT) 0.4 MG SL tablet Place 0.4 mg under the tongue every 5 (five) minutes as needed. For chest pain      . olopatadine (PATANOL) 0.1 % ophthalmic solution Place 1 drop into both eyes daily.      Marland Kitchen PROPYLENE GLYCOL OP Apply 0.4 % to eye 4 (four) times daily.      Marland Kitchen Respiratory Therapy Supplies (PULMO-AIDE COMP/PULMO-NEB DISP) DEVI by Does not apply route.        . traMADol (ULTRAM) 50 MG tablet Take 50 mg by mouth every 6 (six) hours as needed.       No current facility-administered medications on file prior to visit.   Allergies  Allergen Reactions  . Barbiturates Other (See Comments)    unknown  . Phenobarbital Other (See Comments)    unknown     ROS: See HPI for pertinent positives and negatives.  Physical Examination  Filed Vitals:   10/21/13 1004  BP: 181/93  Pulse: 75  Resp: 16   Filed Weights    10/21/13 1004  Weight: 84 lb (38.102 kg)   Body mass index is 17.56 kg/(m^2).   General: A&O x 3, WD, thin, frail appearing elderly female.  Pulmonary: Sym exp, good air movt, CTAB, no rales, rhonchi, or wheezing.  Cardiac: RRR, Nl S1, S2, no detected Murmur.  Vascular: Vessel Right Left  Radial Palpable Palpable  Carotid  without bruit  without bruit  Aorta Not palpable N/A  Femoral Palpable Palpable  Popliteal Not palpable Not palpable  PT Not Palpable Not Palpable  DP Palpable Palpable   Gastrointestinal: soft, NTND, -G/R, - HSM, - masses, - CVAT B.  Musculoskeletal: M/S 2/5 throughout, Extremities without ischemic changes.  Neurologic: Pain and light touch intact in extremities, Motor exam as listed above  Non-Invasive Vascular Imaging  EVAR Duplex (Date: 10/21/2013):   ABDOMINAL AORTA DUPLEX EVALUATION - POST ENDOVASCULAR REPAIR    INDICATION: Evaluation of endovascular abdominal repair of aortic aneurysm.    PREVIOUS INTERVENTION(S): EVAR 11/01/2011.    DUPLEX EXAM:      DIAMETER AP (cm) DIAMETER TRANSVERSE (cm) VELOCITIES (cm/sec)  Aorta 4.67 4.36 42  Right Common Iliac 1.09 1.10 58  Left Common Iliac 1.11 1.14 54    Comparison Study       Date DIAMETER AP (cm) DIAMETER TRANSVERSE (cm)  10/27/2012 5.27 5.12     ADDITIONAL FINDINGS:     IMPRESSION: Patent endovascular abdominal aortic repair with a maximum diameter of 4.67 x 4.36cm.    Compared to the previous exam:  Decrease in maximum diameter since the last exam.   ABI's: Right: 1.02, Left: 1.07, all biphasic waveforms  Medical Decision Making  Lisa Nichols is a 80 y.o. female who presents s/p EVAR.  Pt is asymptomatic with 4.67 cm sac size, decreased from a year ago. ABI's indicate no occlusive arterial disease. She is improving with good care at the nursing facility and physical therapy.  I discussed with the patient the importance of surveillance of the endograft.  The next  endograft duplex will be scheduled for 12 months.  The patient will follow up with [place] in 12 months with these studies.  I emphasized the importance of maximal medical management including strict control of blood pressure, blood glucose, and lipid levels, antiplatelet agents, obtaining regular exercise, and continued cessation of smoking.   The patient was given information about AAA including signs, symptoms, treatment, and how to minimize the risk of enlargement and rupture of aneurysms.  Thank you for allowing [place] to participate in this patient's care.  Charisse March, RN, MSN, FNP-C [facility] [place] Office: [phone]  Clinic Physician: Edilia Bo  10/21/2013, 10:00 AM

## 2013-10-21 NOTE — Patient Instructions (Signed)

## 2013-10-22 NOTE — Addendum Note (Signed)
Addended by: Sharee PimpleMCCHESNEY, MARILYN K on: 10/22/2013 08:18 AM   Modules accepted: Orders

## 2013-10-26 ENCOUNTER — Other Ambulatory Visit: Payer: Medicare Other

## 2013-10-26 ENCOUNTER — Ambulatory Visit: Payer: Medicare Other | Admitting: Family

## 2014-01-08 DEATH — deceased

## 2014-10-22 ENCOUNTER — Encounter: Payer: Self-pay | Admitting: Family

## 2014-10-25 ENCOUNTER — Ambulatory Visit: Payer: Medicare Other | Admitting: Family

## 2014-10-25 ENCOUNTER — Other Ambulatory Visit (HOSPITAL_COMMUNITY): Payer: Medicare Other

## 2014-10-25 ENCOUNTER — Telehealth: Payer: Self-pay | Admitting: Surgery

## 2014-10-25 NOTE — Telephone Encounter (Signed)
Lisa SheetsJames Nichols, son of Lisa ShelterMildred Nichols called to let us know that Ms. Lisa Nichols passed away on August 10, 2014.  She had an appointment for today 10/25/2014.  We need to cancel that appointment.
